# Patient Record
Sex: Female | Born: 1990 | Race: Black or African American | Hispanic: No | Marital: Single | Smoking: Never smoker
Health system: Southern US, Community
[De-identification: ages and names within clinical notes are randomized; demographics above are authoritative.]

## PROBLEM LIST (undated history)

## (undated) DIAGNOSIS — E119 Type 2 diabetes mellitus without complications: Secondary | ICD-10-CM

## (undated) DIAGNOSIS — R87629 Unspecified abnormal cytological findings in specimens from vagina: Secondary | ICD-10-CM

## (undated) DIAGNOSIS — O139 Gestational [pregnancy-induced] hypertension without significant proteinuria, unspecified trimester: Secondary | ICD-10-CM

## (undated) HISTORY — DX: Unspecified abnormal cytological findings in specimens from vagina: R87.629

## (undated) HISTORY — PX: HERNIA REPAIR: SHX51

---
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
## 2020-09-01 ENCOUNTER — Encounter (HOSPITAL_COMMUNITY): Payer: Self-pay | Admitting: *Deleted

## 2020-09-01 ENCOUNTER — Other Ambulatory Visit: Payer: Self-pay

## 2020-09-01 ENCOUNTER — Emergency Department (HOSPITAL_COMMUNITY)
Admission: EM | Admit: 2020-09-01 | Discharge: 2020-09-01 | Disposition: A | Discharge status: Home / Self Care | Payer: Medicaid Other | Attending: Emergency Medicine | Admitting: Emergency Medicine

## 2020-09-01 DIAGNOSIS — R591 Generalized enlarged lymph nodes: Secondary | ICD-10-CM | POA: Diagnosis not present

## 2020-09-01 DIAGNOSIS — R221 Localized swelling, mass and lump, neck: Secondary | ICD-10-CM | POA: Diagnosis present

## 2020-09-01 NOTE — Progress Notes (Signed)
TOC CM received referral to assist with arranging follow up appt. Attempted call to pt and mailbox is full. Unable to leave message. Isidoro Donning RN CCM, [facility] ED TOC CM [phone].

## 2020-09-01 NOTE — ED Triage Notes (Addendum)
Pt complains of lump on the right side of her neck for the past 2 months, noticed it became larger this week. No difficulty swallowing.

## 2020-09-02 NOTE — ED Provider Notes (Signed)
[facility] DEPT Provider Note   CSN: [id number] Arrival date & time: 09/01/20  1601     History No chief complaint on file.   Marilyn Hughes is a 30 y.o. female.  HPI      29yo female presents with concern for area of anterior neck swelling.  Reports she noted it 2 months ago and went to an urgent care and was told to find a PCP.  She did not find a PCP as she thinks she felt nervous and worried about finding out what was going on and was just putting it off. Over the last week she feels the area has grown. Doesn't have pain but some discomfort and pulling sensation that radiates from her anterior neck down and up towards her ear. No sore throat, no dental pain, no fevers, no night sweats. Reports some weight gain and hair falling out.  No fever cough/dyspnea. No difficulty swallowing but does feel pullingsensation at tiems.   History reviewed. No pertinent past medical history.  There are no problems to display for this patient.   History reviewed. No pertinent surgical history.   OB History   No obstetric history on file.     No family history on file.     Home Medications Prior to Admission medications   Not on File    Allergies    Patient has no allergy information on record.  Review of Systems   Review of Systems  Constitutional: Positive for fatigue and unexpected weight change. Negative for fever.  HENT: Negative for sore throat, trouble swallowing and voice change. Facial swelling: neck.   Respiratory: Negative for cough and shortness of breath.   Gastrointestinal: Negative for nausea and vomiting.  Musculoskeletal: Positive for neck pain.  Skin: Negative for wound.    Physical Exam Updated Vital Signs BP (!) 175/101   Pulse (!) 106   Temp 98.7 F (37.1 C) (Oral)   Resp 18   SpO2 96%   Physical Exam Vitals and nursing note reviewed.  Constitutional:      General: She is not in acute distress.    Appearance: Normal  appearance. She is not ill-appearing, toxic-appearing or diaphoretic.  HENT:     Head: Normocephalic.     Right Ear: Tympanic membrane normal.     Left Ear: Tympanic membrane normal.     Mouth/Throat:     Mouth: Mucous membranes are moist.     Pharynx: Oropharynx is clear. No oropharyngeal exudate or posterior oropharyngeal erythema.  Eyes:     Conjunctiva/sclera: Conjunctivae normal.  Cardiovascular:     Rate and Rhythm: Normal rate and regular rhythm.     Pulses: Normal pulses.  Pulmonary:     Effort: Pulmonary effort is normal. No respiratory distress.  Musculoskeletal:        General: No deformity or signs of injury.     Cervical back: No rigidity.  Skin:    General: Skin is warm and dry.     Coloration: Skin is not jaundiced or pale.  Neurological:     General: No focal deficit present.     Mental Status: She is alert and oriented to person, place, and time.     ED Results / Procedures / Treatments   Labs (all labs ordered are listed, but only abnormal results are displayed) Labs Reviewed - No data to display  EKG None  Radiology No results found.  Procedures Procedures (including critical care time)  Medications Ordered in ED Medications -  No data to display  ED Course  I have reviewed the triage vital signs and the nursing notes.  Pertinent labs & imaging results that were available during my care of the patient were reviewed by me and considered in my medical decision making (see chart for details).    MDM Rules/Calculators/A&P                          29yo female presents with concern for area of anterior neck swelling present for 2 months. I do not see signs of PTA, RPA, abscess, Lemierre's or airway compromise.  Mild tachycardia and hypertension may be related to stress of coming to ED and may be repeated as outpatient.  Has some tenderness and suspect adenopathy on exam. Recommend close PCP follow up for further evaluation and consulted CM for  assistance.    Final Clinical Impression(s) / ED Diagnoses Final diagnoses:  Lymphadenopathy    Rx / DC Orders ED Discharge Orders    None       Alvira Monday, MD 09/02/20 1147

## 2020-09-04 ENCOUNTER — Telehealth: Payer: Self-pay | Admitting: *Deleted

## 2020-09-04 NOTE — Telephone Encounter (Signed)
TOC CM attempted call to pt to arrange follow up appt to see PCP. Unable to leave message. Voice mailbox was full. Contacted emergency contact, Jerre Simon and states she will give message for pt to give CM a call. Isidoro Donning RN CCM, [facility] ED TOC CM [phone]

## 2020-09-04 NOTE — Telephone Encounter (Signed)
TOC CM contacted pt to give information on appt scheduled at [facility] on 09/08/2020 with Dr Alvis Lemmings at 230 pm. Explained to call and reschedule if she was unable to keep appt. ED provider updated. Isidoro Donning RN CCM, [facility] ED TOC CM [phone]

## 2020-09-08 ENCOUNTER — Other Ambulatory Visit: Payer: Self-pay

## 2020-09-08 ENCOUNTER — Encounter: Payer: Self-pay | Admitting: Family Medicine

## 2020-09-08 ENCOUNTER — Ambulatory Visit: Payer: Medicaid Other | Attending: Family Medicine | Admitting: Family Medicine

## 2020-09-08 VITALS — BP 123/84 | HR 97 | Ht 65.0 in | Wt 188.4 lb

## 2020-09-08 DIAGNOSIS — R221 Localized swelling, mass and lump, neck: Secondary | ICD-10-CM | POA: Diagnosis not present

## 2020-09-08 DIAGNOSIS — Z1159 Encounter for screening for other viral diseases: Secondary | ICD-10-CM

## 2020-09-08 NOTE — Progress Notes (Signed)
Has lump on neck and states that it is getting bigger.

## 2020-09-08 NOTE — Progress Notes (Signed)
Virtual Visit via Video Note  I connected with Marilyn Hughes, on 09/08/2020 at 2:38 PM by video enabled telemedicine device due to the COVID-19 pandemic and verified that I am speaking with the correct person using two identifiers.   Consent: I discussed the limitations, risks, security and privacy concerns of performing an evaluation and management service by telemedicine and the availability of in person appointments. I also discussed with the patient that there may be a patient responsible charge related to this service. The patient expressed understanding and agreed to proceed.   Location of Patient: Clinic  Location of Provider: Home   Persons participating in Telemedicine visit: Marilyn Hughes-CMA Dr. Margarita Rana     History of Present Illness: 30 year old female here to establish care.   She has had a neck swelling which is getting bigger and sometimes has a tugging sensation for the last 6 months. Seen at the Ed and informs me nothing was done as the E was too full and she needed to obtain a PCP. She has had a huge gain of about 30 lbs in about 3 months. She denies presence of constipation, voice hoarseness. Unknown FHx of thyroid disease.  No past medical history on file. No Known Allergies  No current outpatient medications on file prior to visit.   No current facility-administered medications on file prior to visit.    Observations/Objective: Today's Vitals   09/08/20 1430  BP: 123/84  Pulse: 97  SpO2: 100%  Weight: 188 lb 6.4 oz (85.5 kg)  Height: 5\' 5"  (1.651 m)   Body mass index is 31.35 kg/m.  Awake, alert, oriented x3 Eyes: no exolpthalmos Neck: right side neck mass, not TTP Patient directed on regions to palpate for lymphadenopathy - none detected Respiratory: not in acute distress Mood: normal  Assessment and Plan: 1. Neck swelling Suspicious for goiter Will order labs and thyroid Ultrasound and follow up at next visit - [place] Soft  Tissue Head/Neck (NON-THYROID); Future - T4, free - TSH - Basic Metabolic Panel  2. Need for hepatitis C screening test - HCV RNA quant rflx ultra or genotyp([facility])   Follow Up Instructions: Return in about 6 weeks (around 10/20/2020) for chronic disease management.    I discussed the assessment and treatment plan with the patient. The patient was provided an opportunity to ask questions and all were answered. The patient agreed with the plan and demonstrated an understanding of the instructions.   The patient was advised to call back or seek an in-person evaluation if the symptoms worsen or if the condition fails to improve as anticipated.     I provided 15 minutes total of Telehealth time during this encounter including median intraservice time, reviewing previous notes, investigations, ordering medications, medical decision making, coordinating care and patient verbalized understanding at the end of the visit.     Charlott Rakes, MD, FAAFP. [facility] [place], [place]   09/08/2020, 2:38 PM

## 2020-09-10 LAB — T4, FREE: Free T4: 0.99 ng/dL (ref 0.82–1.77)

## 2020-09-10 LAB — BASIC METABOLIC PANEL
BUN/Creatinine Ratio: 13 (ref 9–23)
BUN: 10 mg/dL (ref 6–20)
CO2: 21 mmol/L (ref 20–29)
Calcium: 9.6 mg/dL (ref 8.7–10.2)
Chloride: 102 mmol/L (ref 96–106)
Creatinine, Ser: 0.77 mg/dL (ref 0.57–1.00)
GFR calc Af Amer: 119 mL/min/{1.73_m2} (ref >59)
GFR calc non Af Amer: 103 mL/min/{1.73_m2} (ref >59)
Glucose: 121 mg/dL — ABNORMAL HIGH (ref 65–99)
Potassium: 4.1 mmol/L (ref 3.5–5.2)
Sodium: 140 mmol/L (ref 134–144)

## 2020-09-10 LAB — HCV RNA QUANT RFLX ULTRA OR GENOTYP: HCV Quant Baseline: NOT DETECTED IU/mL

## 2020-09-10 LAB — TSH: TSH: 0.758 u[IU]/mL (ref 0.450–4.500)

## 2020-09-11 ENCOUNTER — Telehealth: Payer: Self-pay | Admitting: Family Medicine

## 2020-09-11 NOTE — Telephone Encounter (Signed)
Pt returned office call about labs / please advise

## 2020-09-11 NOTE — Telephone Encounter (Signed)
Patient name and DOB has been verified Patient was informed of lab results. Patient had no questions.  

## 2020-09-15 ENCOUNTER — Ambulatory Visit (HOSPITAL_COMMUNITY): Payer: Medicaid Other | Attending: Family Medicine

## 2020-09-23 ENCOUNTER — Other Ambulatory Visit: Payer: Self-pay | Admitting: Family Medicine

## 2020-09-23 ENCOUNTER — Other Ambulatory Visit: Payer: Self-pay

## 2020-09-23 ENCOUNTER — Ambulatory Visit (HOSPITAL_COMMUNITY)
Admission: RE | Admit: 2020-09-23 | Discharge: 2020-09-23 | Disposition: A | Discharge status: Home / Self Care | Payer: Medicaid Other | Source: Ambulatory Visit | Attending: Family Medicine | Admitting: Family Medicine

## 2020-09-23 DIAGNOSIS — E042 Nontoxic multinodular goiter: Secondary | ICD-10-CM

## 2020-09-23 DIAGNOSIS — R221 Localized swelling, mass and lump, neck: Secondary | ICD-10-CM | POA: Insufficient documentation

## 2020-09-26 ENCOUNTER — Telehealth: Payer: Self-pay

## 2020-09-26 NOTE — Telephone Encounter (Signed)
-----   Message from Charlott Rakes, MD sent at 09/23/2020  2:48 PM EST ----- Can you please ensure she has seen this message? Thanks

## 2020-09-26 NOTE — Telephone Encounter (Signed)
Patient has viewed results via mychart on 09/25/20

## 2020-09-27 ENCOUNTER — Encounter: Payer: Self-pay | Admitting: Family Medicine

## 2020-09-29 ENCOUNTER — Other Ambulatory Visit: Payer: Self-pay | Admitting: Family Medicine

## 2020-09-29 MED ORDER — MELOXICAM 7.5 MG PO TABS: 7.5000 mg | ORAL_TABLET | Freq: Every day | ORAL | 1 refills | Status: DC

## 2020-11-05 ENCOUNTER — Ambulatory Visit: Payer: Medicaid Other | Admitting: Family Medicine

## 2020-11-12 ENCOUNTER — Other Ambulatory Visit: Payer: Self-pay

## 2020-11-14 ENCOUNTER — Ambulatory Visit: Payer: Medicaid Other | Admitting: Internal Medicine

## 2020-11-18 NOTE — Progress Notes (Signed)
Name: Marilyn Hughes  MRN/ DOB: [id number], April 03, 1991    Age/ Sex: 30 y.o., female    PCP: Charlott Rakes, MD   Reason for Endocrinology Evaluation: MNG     Date of Initial Endocrinology Evaluation: 11/19/2020     HPI: [name] is a 30 y.o. female with a past medical history of MNG. The patient presented for initial endocrinology clinic visit on 11/19/2020 for consultative assistance with her MNG.   She is accompanied by her sister Today    She was diagnosed with MNG based on thyroid ultrasound 08/2020. This was started after the pt noted local neck swelling that has been increasing associated with change in hair texture and weight gain.   Denies dysphagia  NO discomfort when supine   Denies radiation exposure   No FH of thyroid disease   HISTORY:   Past Medical History: No past medical history on file. Past Surgical History:  Past Surgical History:  Procedure Laterality Date  . back sx  2005   scoliosis   . HERNIA REPAIR        Social History:    Family History: family history includes Diabetes in her mother; Hypertension in her mother.   HOME MEDICATIONS: Allergies as of 11/19/2020   No Known Allergies     Medication List       Accurate as of November 19, 2020  2:18 PM. If you have any questions, ask your nurse or doctor.        meloxicam 7.5 MG tablet Commonly known as: MOBIC Take 1 tablet (7.5 mg total) by mouth daily.         REVIEW OF SYSTEMS: A comprehensive ROS was conducted with the patient and is negative except as per HPI     OBJECTIVE:  VS: BP 118/80 (BP Location: Left Arm, Patient Position: Sitting, Cuff Size: Normal)   Pulse 92   Resp 18   Ht 5\' 4"  (1.626 m)   Wt 191 lb 9.6 oz (86.9 kg)   LMP 10/27/2020   SpO2 99%   BMI 32.89 kg/m    Wt Readings from Last 3 Encounters:  11/19/20 191 lb 9.6 oz (86.9 kg)  09/08/20 188 lb 6.4 oz (85.5 kg)     EXAM: General: Pt appears well and is in NAD  Neck: General: Supple  without adenopathy. Thyroid: Thyroid size normal.  Right nodule appreciated.  Lungs: Clear with good BS bilat with no rales, rhonchi, or wheezes  Heart: Auscultation: RRR.  Abdomen: Normoactive bowel sounds, soft, nontender, without masses or organomegaly palpable  Extremities:  BL LE: No pretibial edema normal ROM and strength.  Skin: Hair: Texture and amount normal with gender appropriate distribution Skin Inspection: No rashes Skin Palpation: Skin temperature, texture, and thickness normal to palpation  Neuro: Cranial nerves: II - XII grossly intact  Motor: Normal strength throughout DTRs: 2+ and symmetric in UE without delay in relaxation phase  Mental Status: Judgment, insight: Intact Orientation: Oriented to time, place, and person Mood and affect: No depression, anxiety, or agitation     DATA REVIEWED:  Results for ROSELIND, KLUS (MRN [id number]) as of 11/18/2020 12:55  Ref. Range 09/08/2020 14:56  TSH Latest Ref Range: 0.450 - 4.500 uIU/mL 0.758  T4,Free(Direct) Latest Ref Range: 0.82 - 1.77 ng/dL 0.99    Thyroid Ultrasound 09/23/2020   Number of spongiform nodules >/=  2 cm not described below (TR1): 0  Number of mixed cystic and solid nodules >/= 1.5 cm not described below (  TR2): 0  _________________________________________________________  Nodule # 1:  Location: Right; Mid  Maximum size: 3.3 cm; Other 2 dimensions: 2.7 cm x 1.9 cm  Composition: solid/almost completely solid (2)  Echogenicity: isoechoic (1)  Shape: not taller-than-wide (0)  Margins: smooth (0)  Echogenic foci: none (0)  ACR TI-RADS total points: 3.  ACR TI-RADS risk category: TR1 (0-1 points).  ACR TI-RADS recommendations:  Nodule meets criteria for biopsy  _________________________________________________________  Nodule # 2:  Location: Right; Inferior  Maximum size: 1.3 cm; Other 2 dimensions: 1.3 cm x 1.2 cm  Composition: mixed cystic and solid  (1)  Echogenicity: isoechoic (1)  Shape: not taller-than-wide (0)  Margins: ill-defined (0)  Echogenic foci: none (0)  ACR TI-RADS total points: 2.  ACR TI-RADS risk category: TR2 (2 points).  ACR TI-RADS recommendations:  Nodule does not meet criteria for surveillance or biopsy  _________________________________________________________  Nodule # 3:  Location: Left; Mid  Maximum size: 1.4 cm; Other 2 dimensions: 1.3 cm x 1.1 cm  Composition: spongiform (0)  ACR TI-RADS recommendations:  Spongiform nodule does not meet criteria for surveillance or biopsy  _________________________________________________________  Nodule # 4:  Location: Left; Inferior  Maximum size: 2.6 cm; Other 2 dimensions: 2.0 cm x 1.8 cm  Composition: spongiform (0)  ACR TI-RADS recommendations:  Spongiform nodule does not meet criteria for surveillance or biopsy  _________________________________________________________  No adenopathy  IMPRESSION: Multinodular thyroid.  Right mid thyroid nodule (labeled 1, TR 3, 3.3 cm) meets criteria for biopsy, as designated by the newly established ACR TI-RADS criteria, and referral for biopsy is recommended.  ASSESSMENT/PLAN/RECOMMENDATIONS:   1. MNG:  - Pt is biochemically euthyroid  - No local neck symptoms  - we reviewed her ultrasound results, she in agreement in proceeding with FNA of the right mid thyroid nodule    F/U in 1 yr   Signed electronically by: Mack Guise, MD  [facility] Endocrinology  [address] [place],  [address] Phone: [phone] FAX: [phone]   CC: Charlott Rakes, [place] [address] Phone: [phone] Fax: [phone]   Return to Endocrinology clinic as below: Future Appointments  Date Time Provider [place]  12/31/2020  1:30 PM Charlott Rakes, MD [facility] None  11/19/2021  10:10 AM Marialena Wollen, Melanie Crazier, MD [facility] None

## 2020-11-19 ENCOUNTER — Other Ambulatory Visit: Payer: Self-pay

## 2020-11-19 ENCOUNTER — Ambulatory Visit (INDEPENDENT_AMBULATORY_CARE_PROVIDER_SITE_OTHER): Payer: Medicaid Other | Admitting: Internal Medicine

## 2020-11-19 ENCOUNTER — Encounter: Payer: Self-pay | Admitting: Internal Medicine

## 2020-11-19 VITALS — BP 118/80 | HR 92 | Resp 18 | Ht 64.0 in | Wt 191.6 lb

## 2020-11-19 DIAGNOSIS — E042 Nontoxic multinodular goiter: Secondary | ICD-10-CM | POA: Diagnosis not present

## 2020-12-03 ENCOUNTER — Ambulatory Visit
Admission: RE | Admit: 2020-12-03 | Discharge: 2020-12-03 | Disposition: A | Discharge status: Home / Self Care | Payer: Medicaid Other | Source: Ambulatory Visit | Attending: Internal Medicine | Admitting: Internal Medicine

## 2020-12-03 ENCOUNTER — Other Ambulatory Visit (HOSPITAL_COMMUNITY)
Admission: RE | Admit: 2020-12-03 | Discharge: 2020-12-03 | Disposition: A | Discharge status: Home / Self Care | Payer: Medicaid Other | Source: Ambulatory Visit | Attending: Internal Medicine | Admitting: Internal Medicine

## 2020-12-03 DIAGNOSIS — E041 Nontoxic single thyroid nodule: Secondary | ICD-10-CM | POA: Diagnosis present

## 2020-12-03 DIAGNOSIS — E042 Nontoxic multinodular goiter: Secondary | ICD-10-CM

## 2020-12-03 DIAGNOSIS — D497 Neoplasm of unspecified behavior of endocrine glands and other parts of nervous system: Secondary | ICD-10-CM | POA: Diagnosis not present

## 2020-12-04 LAB — CYTOLOGY - NON PAP

## 2020-12-17 ENCOUNTER — Encounter (HOSPITAL_COMMUNITY): Payer: Self-pay

## 2020-12-31 ENCOUNTER — Ambulatory Visit: Payer: Medicaid Other | Admitting: Family Medicine

## 2021-02-26 ENCOUNTER — Ambulatory Visit: Payer: Medicaid Other | Admitting: Family Medicine

## 2021-07-01 ENCOUNTER — Ambulatory Visit: Payer: Medicaid Other | Admitting: Internal Medicine

## 2021-09-03 ENCOUNTER — Emergency Department (HOSPITAL_COMMUNITY)
Admission: EM | Admit: 2021-09-03 | Discharge: 2021-09-04 | Disposition: A | Discharge status: Home / Self Care | Payer: Medicaid Other | Attending: Emergency Medicine | Admitting: Emergency Medicine

## 2021-09-03 ENCOUNTER — Encounter (HOSPITAL_COMMUNITY): Payer: Self-pay

## 2021-09-03 ENCOUNTER — Ambulatory Visit: Payer: Self-pay | Admitting: *Deleted

## 2021-09-03 ENCOUNTER — Other Ambulatory Visit: Payer: Self-pay

## 2021-09-03 DIAGNOSIS — Z5321 Procedure and treatment not carried out due to patient leaving prior to being seen by health care provider: Secondary | ICD-10-CM | POA: Diagnosis not present

## 2021-09-03 DIAGNOSIS — R42 Dizziness and giddiness: Secondary | ICD-10-CM | POA: Insufficient documentation

## 2021-09-03 DIAGNOSIS — R110 Nausea: Secondary | ICD-10-CM | POA: Insufficient documentation

## 2021-09-03 DIAGNOSIS — R632 Polyphagia: Secondary | ICD-10-CM | POA: Diagnosis not present

## 2021-09-03 DIAGNOSIS — R3589 Other polyuria: Secondary | ICD-10-CM | POA: Diagnosis not present

## 2021-09-03 DIAGNOSIS — R739 Hyperglycemia, unspecified: Secondary | ICD-10-CM | POA: Diagnosis not present

## 2021-09-03 DIAGNOSIS — R519 Headache, unspecified: Secondary | ICD-10-CM | POA: Insufficient documentation

## 2021-09-03 LAB — CBC WITH DIFFERENTIAL/PLATELET
Abs Immature Granulocytes: 0.01 10*3/uL (ref 0.00–0.07)
Basophils Absolute: 0 10*3/uL (ref 0.0–0.1)
Basophils Relative: 0 %
Eosinophils Absolute: 0.1 10*3/uL (ref 0.0–0.5)
Eosinophils Relative: 2 %
HCT: 38.3 % (ref 36.0–46.0)
Hemoglobin: 12.1 g/dL (ref 12.0–15.0)
Immature Granulocytes: 0 %
Lymphocytes Relative: 22 %
Lymphs Abs: 1.6 10*3/uL (ref 0.7–4.0)
MCH: 26.1 pg (ref 26.0–34.0)
MCHC: 31.6 g/dL (ref 30.0–36.0)
MCV: 82.7 fL (ref 80.0–100.0)
Monocytes Absolute: 0.4 10*3/uL (ref 0.1–1.0)
Monocytes Relative: 5 %
Neutro Abs: 5.3 10*3/uL (ref 1.7–7.7)
Neutrophils Relative %: 71 %
Platelets: 343 10*3/uL (ref 150–400)
RBC: 4.63 MIL/uL (ref 3.87–5.11)
RDW: 14.3 % (ref 11.5–15.5)
WBC: 7.5 10*3/uL (ref 4.0–10.5)
nRBC: 0 % (ref 0.0–0.2)

## 2021-09-03 LAB — COMPREHENSIVE METABOLIC PANEL
ALT: 19 U/L (ref 0–44)
AST: 21 U/L (ref 15–41)
Albumin: 3.8 g/dL (ref 3.5–5.0)
Alkaline Phosphatase: 104 U/L (ref 38–126)
Anion gap: 8 (ref 5–15)
BUN: 11 mg/dL (ref 6–20)
CO2: 23 mmol/L (ref 22–32)
Calcium: 9.1 mg/dL (ref 8.9–10.3)
Chloride: 103 mmol/L (ref 98–111)
Creatinine, Ser: 0.68 mg/dL (ref 0.44–1.00)
GFR, Estimated: >60 mL/min (ref >60)
Glucose, Bld: 251 mg/dL — ABNORMAL HIGH (ref 70–99)
Potassium: 4 mmol/L (ref 3.5–5.1)
Sodium: 134 mmol/L — ABNORMAL LOW (ref 135–145)
Total Bilirubin: 0.5 mg/dL (ref 0.3–1.2)
Total Protein: 8.7 g/dL — ABNORMAL HIGH (ref 6.5–8.1)

## 2021-09-03 LAB — BETA-HYDROXYBUTYRIC ACID: Beta-Hydroxybutyric Acid: 0.42 mmol/L — ABNORMAL HIGH (ref 0.05–0.27)

## 2021-09-03 NOTE — Telephone Encounter (Signed)
Reason for Disposition  New-onset diabetes suspected (e.g., frequent urination, weakness, weight loss)  Answer Assessment - Initial Assessment Questions 1. BLOOD GLUCOSE: "What is your blood glucose level?"      376- yesterday, 333- today 2. ONSET: "When did you check the blood glucose?"     1:45 3. USUAL RANGE: "What is your glucose level usually?" (e.g., usual fasting morning value, usual evening value)     Does not check 4. KETONES: "Do you check for ketones (urine or blood test strips)?" If yes, ask: "What does the test show now?"      na 5. TYPE 1 or 2:  "Do you know what type of diabetes you have?"  (e.g., Type 1, Type 2, Gestational; doesn't know)      Was gestational 2 years ago 73. INSULIN: "Do you take insulin?" "What type of insulin(s) do you use? What is the mode of delivery? (syringe, pen; injection or pump)?"      no 7. DIABETES PILLS: "Do you take any pills for your diabetes?" If yes, ask: "Have you missed taking any pills recently?"     no 8. OTHER SYMPTOMS: "Do you have any symptoms?" (e.g., fever, frequent urination, difficulty breathing, dizziness, weakness, vomiting)     Tingling in finger tips, fatigue/nausea, thirsty  9. PREGNANCY: "Is there any chance you are pregnant?" "When was your last menstrual period?"     No- LMP-12/30  Protocols used: Diabetes - High Blood Sugar-A-AH

## 2021-09-03 NOTE — Telephone Encounter (Signed)
°  Chief Complaint: elevated glucose- possible new onset Symptoms: fatigue, nausea, thirst, tingling in fingertips Frequency: 2 days Pertinent Negatives: Patient denies current diabetes diagnosis- high risk- gestational 2 years ago Disposition: [] ED /[] Urgent Care (no appt availability in office) / [] Appointment(In office/virtual)/ []  [place] [facility]/ [] Home Care/ [] Refused Recommended Disposition /[x] [place] [facility]/ []  Follow-up with PCP Additional Notes: Patient advised mobile unit- if does not make it there- UC

## 2021-09-03 NOTE — ED Triage Notes (Signed)
Pt Bib EMS from UC. Pt c/o blood sugar running high x2 years. Pt tried to see PCP today, unable to, went to UC, UC called EMS. CBG 282. Pt c/o headache.

## 2021-09-03 NOTE — ED Provider Triage Note (Signed)
Emergency Medicine Provider Triage Evaluation Note  Marilyn Hughes , a 31 y.o. female  was evaluated in triage.  Pt complains of polyphagia, polyuria, headache, dizziness, and nausea over the last couple weeks.  Patient does have a history of gestational diabetes.  Mother is diabetic and for the patient to check her blood sugars.  She checked in the last couple days and it was over 300.  She was sent to the emergency department today from urgent care.  Review of Systems  Positive:  Negative: See above   Physical Exam  BP (!) 126/98    Pulse 77    Temp 98.4 F (36.9 C) (Oral)    Resp 18    SpO2 99%  Gen:   Awake, no distress   Resp:  Normal effort  MSK:   Moves extremities without difficulty  Other:    Medical Decision Making  Medically screening exam initiated at 6:06 PM.  Appropriate orders placed.  Davis Vannatter was informed that the remainder of the evaluation will be completed by another provider, this initial triage assessment does not replace that evaluation, and the importance of remaining in the ED until their evaluation is complete.     Marilyn Hughes, [place] 09/03/21 [phone]

## 2021-09-03 NOTE — Telephone Encounter (Signed)
Called patient to schedule an apt for 09/15/2021. Pt. Is at UC to address sx's.

## 2021-09-04 ENCOUNTER — Ambulatory Visit: Payer: Self-pay

## 2021-09-04 ENCOUNTER — Ambulatory Visit
Admission: EM | Admit: 2021-09-04 | Discharge: 2021-09-04 | Disposition: A | Discharge status: Home / Self Care | Payer: Medicaid Other | Attending: Internal Medicine | Admitting: Internal Medicine

## 2021-09-04 DIAGNOSIS — R739 Hyperglycemia, unspecified: Secondary | ICD-10-CM | POA: Diagnosis not present

## 2021-09-04 MED ORDER — METFORMIN HCL 500 MG PO TABS: 500.0000 mg | ORAL_TABLET | Freq: Every day | ORAL | 0 refills | Status: DC

## 2021-09-04 MED ORDER — BLOOD GLUCOSE MONITOR KIT: PACK | 0 refills | Status: DC

## 2021-09-04 MED ORDER — FLUCONAZOLE 150 MG PO TABS: 150.0000 mg | ORAL_TABLET | Freq: Every day | ORAL | 0 refills | Status: DC

## 2021-09-04 NOTE — Telephone Encounter (Signed)
°  Chief Complaint: hyperglycemia Symptoms: BS 265, increased thirst, urination, feeling, hot, tingling in fingertips. Frequency: several days Pertinent Negatives: NA Disposition: [x] ED /[] Urgent Care (no appt availability in office) / [] Appointment(In office/virtual)/ []  [place] [facility]/ [] Home Care/ [] Refused Recommended Disposition /[] [place] [facility]/ []  Follow-up with PCP Additional Notes: Pt was advised yesterday to go to UC. She states she went to 2 different ones and they advised her nothing they could do to go to ED. Pt went to ED and left before being seen. Pt calling back today concerned about BS and symptomatic. Advised her to go to back to ED and be seen. Pt verbalized understanding.    Summary: discuss blood sugar level reading   Pt requests to speak with a nurse regarding blood sugar level reading of 265. Cb# [phone]      Reason for Disposition  New-onset diabetes suspected (e.g., frequent urination, weakness, weight loss)  Answer Assessment - Initial Assessment Questions 1. BLOOD GLUCOSE: "What is your blood glucose level?"      265 2. ONSET: "When did you check the blood glucose?"     Several days 5. TYPE 1 or 2:  "Do you know what type of diabetes you have?"  (e.g., Type 1, Type 2, Gestational; doesn't know)      unsure 6. INSULIN: "Do you take insulin?" "What type of insulin(s) do you use? What is the mode of delivery? (syringe, pen; injection or pump)?"      no 7. DIABETES PILLS: "Do you take any pills for your diabetes?" If yes, ask: "Have you missed taking any pills recently?"     no 8. OTHER SYMPTOMS: "Do you have any symptoms?" (e.g., fever, frequent urination, difficulty breathing, dizziness, weakness, vomiting)     Nausea, increased thirst and urination, hot feeling.  Protocols used: Diabetes - High Blood Sugar-A-AH

## 2021-09-04 NOTE — ED Provider Notes (Signed)
[facility]    CSN: [id number] Arrival date & time: 09/04/21  1452      History   Chief Complaint Chief Complaint  Patient presents with   Hyperglycemia    HPI Marilyn Hughes is a 31 y.o. female with past medical history of gestational diabetes presents to urgent care today with complaints of hyperglycemia.  Patient reports recent polydipsia, polyuria and fatigue she noticed 3 days ago.  Symptoms prompted her to use her mother's glucometer to check CBG which was 376 at that time.  Patient went to ED yesterday but left without being seen due to long wait times.  She attempted to make appointment with PCP but unable to be seen until 2/1.  Patient denies any recent fever or chills, diarrhea, vomiting, dysuria, chest pain, SOB.  Again patient does endorse some polyuria and polydipsia.  No recent change in weight.  Patient also feels like she has yeast infection with vaginal irritation, itching and cloudy discharge.   Had gestational diabetes with pregnancy 2020    History reviewed. No pertinent past medical history.  There are no problems to display for this patient.   Past Surgical History:  Procedure Laterality Date   back sx  2005   scoliosis    HERNIA REPAIR      OB History   No obstetric history on file.      Home Medications    Prior to Admission medications   Medication Sig Start Date End Date Taking? Authorizing Provider  blood glucose meter kit and supplies KIT Dispense based on patient and insurance preference. Use up to four times daily as directed. 09/04/21  Yes Rudolpho Sevin, NP  fluconazole (DIFLUCAN) 150 MG tablet Take 1 tablet (150 mg total) by mouth daily. 09/04/21  Yes Rudolpho Sevin, NP  metFORMIN (GLUCOPHAGE) 500 MG tablet Take 1 tablet (500 mg total) by mouth daily with breakfast. 09/04/21 09/04/22 Yes Rudolpho Sevin, NP  meloxicam (MOBIC) 7.5 MG tablet Take 1 tablet (7.5 mg total) by mouth daily. 09/29/20   Charlott Rakes, MD    Family  History Family History  Problem Relation Age of Onset   Hypertension Mother    Diabetes Mother     Social History     Allergies   Patient has no known allergies.   Review of Systems As stated in HPI otherwise negative   Physical Exam Triage Vital Signs ED Triage Vitals  Enc Vitals Group     BP 09/04/21 1548 129/89     Pulse Rate 09/04/21 1548 95     Resp 09/04/21 1548 18     Temp 09/04/21 1548 98.8 F (37.1 C)     Temp Source 09/04/21 1548 Oral     SpO2 09/04/21 1548 98 %     Weight --      Height --      Head Circumference --      Peak Flow --      Pain Score 09/04/21 1547 7     Pain Loc --      Pain Edu? --      Excl. in [place]? --    No data found.  Updated Vital Signs BP 129/89 (BP Location: Left Arm)    Pulse 95    Temp 98.8 F (37.1 C) (Oral)    Resp 18    LMP 08/28/2021    SpO2 98%   Visual Acuity Right Eye Distance:   Left Eye Distance:   Bilateral Distance:  Right Eye Near:   Left Eye Near:    Bilateral Near:     Physical Exam Constitutional:      General: She is not in acute distress.    Appearance: Normal appearance. She is not ill-appearing, toxic-appearing or diaphoretic.  HENT:     Mouth/Throat:     Mouth: Mucous membranes are moist.     Pharynx: No oropharyngeal exudate or posterior oropharyngeal erythema.  Eyes:     Extraocular Movements: Extraocular movements intact.     Conjunctiva/sclera: Conjunctivae normal.  Cardiovascular:     Rate and Rhythm: Normal rate and regular rhythm.     Heart sounds: No murmur heard.   No friction rub. No gallop.  Pulmonary:     Effort: Pulmonary effort is normal.     Breath sounds: Normal breath sounds. No wheezing, rhonchi or rales.  Abdominal:     General: Bowel sounds are normal.     Palpations: Abdomen is soft.     Tenderness: There is no abdominal tenderness. There is no right CVA tenderness, left CVA tenderness, guarding or rebound.  Musculoskeletal:     Cervical back: Normal range of  motion and neck supple. No tenderness.  Lymphadenopathy:     Cervical: No cervical adenopathy.  Skin:    General: Skin is warm and dry.  Neurological:     General: No focal deficit present.     Mental Status: She is alert and oriented to person, place, and time.     Motor: No weakness.  Psychiatric:        Mood and Affect: Mood normal.        Behavior: Behavior normal.        Thought Content: Thought content normal.     UC Treatments / Results  Labs (all labs ordered are listed, but only abnormal results are displayed) Labs Reviewed  HEMOGLOBIN A1C - Abnormal; Notable for the following components:      Result Value   Hgb A1c MFr Bld 10.1 (*)    All other components within normal limits   Narrative:    Performed at:  [address] [place], [place]  [id number] Lab Director: Rush Farmer MD, Phone:  [phone]    EKG   Radiology No results found.  Procedures Procedures (including critical care time)  Medications Ordered in UC Medications - No data to display  Initial Impression / Assessment and Plan / UC Course  I have reviewed the triage vital signs and the nursing notes.  Pertinent labs & imaging results that were available during my care of the patient were reviewed by me and considered in my medical decision making (see chart for details).  Hyperglycemia -New onset DM2 with history of gestational diabetes -Blood glucose 251 yesterday and 260 at this visit.  No evidence of HONKS or DKA on labs done in ED prior to [facility] yesterday -Patient unable to get appointment with PCP until 2/1.  In the meantime, will start on metformin $RemoveBefo'500mg'rbpHBofWiMr$  daily, daily CBG monitoring.  Will likely need increased dose at follow-up visit -Patient with good understanding of CBG monitoring and any red flag symptoms given mother's history of DM type II -Keep follow-up appointment.  Follow-up sooner for any worsening or persistent symptoms -We will check A1c for  follow-up appointment  Reviewed expections re: course of current medical issues. Questions answered. Outlined signs and symptoms indicating need for more acute intervention. Pt verbalized understanding. AVS given  Final Clinical Impressions(s) / UC Diagnoses  Final diagnoses:  Hyperglycemia     Discharge Instructions      Your lab work done last night in the emergency department is reassuring.  However your blood sugar is quite high indicating type 2 diabetes.  I am going to go ahead and start you on oral medication.  I have also written for you to get a blood glucose monitor.  Go ahead and start checking her blood sugars daily and record for your upcoming appointment.  Call community health and wellness to see if you can get in sooner than previously scheduled appointment.  Go to ER should you develop any worsening symptoms, lethargy, shortness of breath or chest pain.     ED Prescriptions     Medication Sig Dispense Auth. Provider   metFORMIN (GLUCOPHAGE) 500 MG tablet Take 1 tablet (500 mg total) by mouth daily with breakfast. 30 tablet Rudolpho Sevin, NP   blood glucose meter kit and supplies KIT Dispense based on patient and insurance preference. Use up to four times daily as directed. 1 each Rudolpho Sevin, NP   fluconazole (DIFLUCAN) 150 MG tablet Take 1 tablet (150 mg total) by mouth daily. 2 tablet Rudolpho Sevin, NP      PDMP not reviewed this encounter.   Rudolpho Sevin, NP 09/07/21 [phone]

## 2021-09-04 NOTE — Discharge Instructions (Addendum)
Your lab work done last night in the emergency department is reassuring.  However your blood sugar is quite high indicating type 2 diabetes.  I am going to go ahead and start you on oral medication.  I have also written for you to get a blood glucose monitor.  Go ahead and start checking her blood sugars daily and record for your upcoming appointment.  Call community health and wellness to see if you can get in sooner than previously scheduled appointment.  Go to ER should you develop any worsening symptoms, lethargy, shortness of breath or chest pain.

## 2021-09-04 NOTE — ED Triage Notes (Addendum)
Pt reports felling fatigue and having tingling in her limbs. Patient states she has a family hx of diabetes type 2. She does report checking her blood glucose today and it was 260. Pt does reports having headaches as well. Pt is alert & oriented x 4.

## 2021-09-05 LAB — HEMOGLOBIN A1C
Est. average glucose Bld gHb Est-mCnc: 243 mg/dL
Hgb A1c MFr Bld: 10.1 % — ABNORMAL HIGH (ref 4.8–5.6)

## 2021-09-07 NOTE — Telephone Encounter (Signed)
Pt was seen at the UC on 09/04/2021

## 2021-10-05 ENCOUNTER — Other Ambulatory Visit: Payer: Self-pay | Admitting: Family Medicine

## 2021-10-05 NOTE — Telephone Encounter (Signed)
Medication Refill - Medication: metFORMIN (GLUCOPHAGE) 500 MG tablet   Has the patient contacted their pharmacy? Yes.   (Agent: If no, request that the patient contact the pharmacy for the refill. If patient does not wish to contact the pharmacy document the reason why and proceed with request.) (Agent: If yes, when and what did the pharmacy advise?)  Preferred Pharmacy (with phone number or street name):  [id number] [address], [place]  [place] [address]  Phone: [phone] Fax: [phone]   Has the patient been seen for an appointment in the last year OR does the patient have an upcoming appointment? Yes.    Agent: Please be advised that RX refills may take up to 3 business days. We ask that you follow-up with your pharmacy.

## 2021-10-06 MED ORDER — METFORMIN HCL 500 MG PO TABS: 500.0000 mg | ORAL_TABLET | Freq: Every day | ORAL | 1 refills | Status: DC

## 2021-10-06 NOTE — Telephone Encounter (Signed)
Requested medications are due for refill today.  yes  Requested medications are on the active medications list.  yes  Last refill. 09/04/2021 #30 0 refills  Future visit scheduled.   yes  Notes to clinic.  Courtesy refill given. Pt last seen 09/08/2020.    Requested Prescriptions  Pending Prescriptions Disp Refills   metFORMIN (GLUCOPHAGE) 500 MG tablet 30 tablet 0    Sig: Take 1 tablet (500 mg total) by mouth daily with breakfast.     Endocrinology:  Diabetes - Biguanides Failed - 10/05/2021  6:17 PM      Failed - HBA1C is between 0 and 7.9 and within 180 days    Hgb A1c MFr Bld  Date Value Ref Range Status  09/04/2021 10.1 (H) 4.8 - 5.6 % Final    Comment:             Prediabetes: 5.7 - 6.4          Diabetes: >6.4          Glycemic control for adults with diabetes: <7.0           Failed - B12 Level in normal range and within 720 days    No results found for: VITAMINB12        Failed - Valid encounter within last 6 months    Recent Outpatient Visits           1 year ago Neck swelling   [place], Charlane Ferretti, MD       Future Appointments             In 1 month Charlott Rakes, MD [place] in normal range and within 360 days    Creatinine, Ser  Date Value Ref Range Status  09/03/2021 0.68 0.44 - 1.00 mg/dL Final          Passed - eGFR in normal range and within 360 days    GFR calc Af Amer  Date Value Ref Range Status  09/08/2020 119 >59 mL/min/1.73 Final    Comment:    **In accordance with recommendations from the NKF-ASN Task force,**   [facility] is in the process of updating its eGFR calculation to the   2021 CKD-EPI creatinine equation that estimates kidney function   without a race variable.    GFR, Estimated  Date Value Ref Range Status  09/03/2021 >60 >60 mL/min Final    Comment:    (NOTE) Calculated using the CKD-EPI Creatinine Equation (2021)            Passed - CBC within normal limits and completed in the last 12 months    WBC  Date Value Ref Range Status  09/03/2021 7.5 4.0 - 10.5 K/uL Final   RBC  Date Value Ref Range Status  09/03/2021 4.63 3.87 - 5.11 MIL/uL Final   Hemoglobin  Date Value Ref Range Status  09/03/2021 12.1 12.0 - 15.0 g/dL Final   HCT  Date Value Ref Range Status  09/03/2021 38.3 36.0 - 46.0 % Final   MCHC  Date Value Ref Range Status  09/03/2021 31.6 30.0 - 36.0 g/dL Final   [facility]  Date Value Ref Range Status  09/03/2021 26.1 26.0 - 34.0 pg Final   MCV  Date Value Ref Range Status  09/03/2021 82.7 80.0 - 100.0 fL Final   No results found for: PLTCOUNTKUC, LABPLAT, POCPLA RDW  Date Value Ref Range Status  09/03/2021 14.3 11.5 - 15.5 % Final

## 2021-10-07 ENCOUNTER — Other Ambulatory Visit: Payer: Self-pay | Admitting: Family Medicine

## 2021-10-07 ENCOUNTER — Encounter: Payer: Self-pay | Admitting: Family Medicine

## 2021-10-07 MED ORDER — GLUCOSE BLOOD VI STRP: ORAL_STRIP | 12 refills | Status: DC

## 2021-10-07 MED ORDER — ACCU-CHEK SOFTCLIX LANCETS MISC: 12 refills | Status: DC

## 2021-11-04 ENCOUNTER — Ambulatory Visit: Payer: Medicaid Other | Admitting: Family Medicine

## 2021-11-16 ENCOUNTER — Encounter: Payer: Self-pay | Admitting: Family Medicine

## 2021-11-16 ENCOUNTER — Other Ambulatory Visit: Payer: Self-pay

## 2021-11-16 ENCOUNTER — Ambulatory Visit: Payer: Medicaid Other | Attending: Family Medicine | Admitting: Family Medicine

## 2021-11-16 ENCOUNTER — Other Ambulatory Visit: Payer: Self-pay | Admitting: Family Medicine

## 2021-11-16 VITALS — BP 137/90 | HR 85 | Ht 64.0 in | Wt 193.2 lb

## 2021-11-16 DIAGNOSIS — E1169 Type 2 diabetes mellitus with other specified complication: Secondary | ICD-10-CM

## 2021-11-16 DIAGNOSIS — E042 Nontoxic multinodular goiter: Secondary | ICD-10-CM | POA: Diagnosis not present

## 2021-11-16 DIAGNOSIS — M546 Pain in thoracic spine: Secondary | ICD-10-CM | POA: Diagnosis not present

## 2021-11-16 DIAGNOSIS — E669 Obesity, unspecified: Secondary | ICD-10-CM

## 2021-11-16 DIAGNOSIS — Z6833 Body mass index (BMI) 33.0-33.9, adult: Secondary | ICD-10-CM

## 2021-11-16 DIAGNOSIS — G8929 Other chronic pain: Secondary | ICD-10-CM

## 2021-11-16 LAB — GLUCOSE, POCT (MANUAL RESULT ENTRY): POC Glucose: 177 mg/dl — AB (ref 70–99)

## 2021-11-16 MED ORDER — OZEMPIC (0.25 OR 0.5 MG/DOSE) 2 MG/1.5ML ~~LOC~~ SOPN: 0.2500 mg | PEN_INJECTOR | SUBCUTANEOUS | 6 refills | Status: DC

## 2021-11-16 MED ORDER — GLUCOSE BLOOD VI STRP: ORAL_STRIP | 12 refills | Status: DC

## 2021-11-16 MED ORDER — TIZANIDINE HCL 4 MG PO CAPS: 4.0000 mg | ORAL_CAPSULE | Freq: Three times a day (TID) | ORAL | 1 refills | Status: DC

## 2021-11-16 MED ORDER — METFORMIN HCL 500 MG PO TABS: 1000.0000 mg | ORAL_TABLET | Freq: Two times a day (BID) | ORAL | 6 refills | Status: DC

## 2021-11-16 NOTE — Progress Notes (Signed)
CBG 177. 

## 2021-11-16 NOTE — Progress Notes (Signed)
? ?Subjective:  ?Patient ID: Marilyn Hughes, female    DOB: [birth year]  Age: 31 y.o. MRN: [id number] ? ?CC: Diabetes ? ? ?HPI ?Marilyn Hughes is a 31 y.o. year old female with a history of multinodular thyroid, type 2 diabetes mellitus (A1c 10.1) ? ?Interval History: ?Last seen by endocrine for management of her multinodular thyroid in 10/2020, pathology of nodule was negative for malignancy and she was advised to follow-up in 1 year.  Denies presence of dysphagia or dyspnea.  She states she has an upcoming appointment. ? ?She has rods in her back from Scoliosis and can feel popping of her back on the right side ever since she gained weight. She uses excedrins for her back pain ? ?She has no hypoglycemia, neuropathy, blurry vision. Random sugars run as high as 200-230. ?No past medical history on file. ? ?Past Surgical History:  ?Procedure Laterality Date  ? back sx  2005  ? scoliosis   ? HERNIA REPAIR    ? ? ?Family History  ?Problem Relation Age of Onset  ? Hypertension Mother   ? Diabetes Mother   ? ? ?Social History  ? ?Socioeconomic History  ? Marital status: Single  ?  Spouse name: Not on file  ? Number of children: Not on file  ? Years of education: Not on file  ? Highest education level: Not on file  ?Occupational History  ? Not on file  ?Tobacco Use  ? Smoking status: Not on file  ? Smokeless tobacco: Not on file  ?Substance and Sexual Activity  ? Alcohol use: Not on file  ? Drug use: Not on file  ? Sexual activity: Not on file  ?Other Topics Concern  ? Not on file  ?Social History Narrative  ? Not on file  ? ?Social Determinants of Health  ? ?Financial Resource Strain: Not on file  ?Food Insecurity: Not on file  ?Transportation Needs: Not on file  ?Physical Activity: Not on file  ?Stress: Not on file  ?Social Connections: Not on file  ? ? ?No Known Allergies ? ?Outpatient Medications Prior to Visit  ?Medication Sig Dispense Refill  ? Accu-Chek Softclix Lancets lancets Use as instructed tid before meals 100 each  12  ? blood glucose meter kit and supplies KIT Dispense based on patient and insurance preference. Use up to four times daily as directed. 1 each 0  ? meloxicam (MOBIC) 7.5 MG tablet Take 1 tablet (7.5 mg total) by mouth daily. 30 tablet 1  ? glucose blood test strip Use as instructed 100 each 12  ? metFORMIN (GLUCOPHAGE) 500 MG tablet Take 1 tablet (500 mg total) by mouth daily with breakfast. 30 tablet 1  ? fluconazole (DIFLUCAN) 150 MG tablet Take 1 tablet (150 mg total) by mouth daily. 2 tablet 0  ? ?No facility-administered medications prior to visit.  ? ? ? ?ROS ?Review of Systems  ?Constitutional:  Negative for activity change, appetite change and fatigue.  ?HENT:  Negative for congestion, sinus pressure and sore throat.   ?Eyes:  Negative for visual disturbance.  ?Respiratory:  Negative for cough, chest tightness, shortness of breath and wheezing.   ?Cardiovascular:  Negative for chest pain and palpitations.  ?Gastrointestinal:  Negative for abdominal distention, abdominal pain and constipation.  ?Endocrine: Negative for polydipsia.  ?Genitourinary:  Negative for dysuria and frequency.  ?Musculoskeletal:  Positive for back pain. Negative for arthralgias.  ?Skin:  Negative for rash.  ?Neurological:  Negative for tremors, light-headedness and numbness.  ?Hematological:  Does not bruise/bleed easily.  ?Psychiatric/Behavioral:  Negative for agitation and behavioral problems.   ? ?Objective:  ?BP 137/90   Pulse 85   Ht $R'5\' 4"'TM$  (1.626 m)   Wt 193 lb 3.2 oz (87.6 kg)   SpO2 100%   BMI 33.16 kg/m?  ? ?BP/Weight 11/16/2021 09/04/2021 09/04/2021  ?Systolic BP 211 941 740  ?Diastolic BP 90 89 98  ?Wt. (Lbs) 193.2 - -  ?BMI 33.16 - -  ? ? ? ? ?Physical Exam ?Constitutional:   ?   Appearance: She is well-developed.  ?Neck:  ?   Comments: Goiter ?Cardiovascular:  ?   Rate and Rhythm: Normal rate.  ?   Heart sounds: Normal heart sounds. No murmur heard. ?Pulmonary:  ?   Effort: Pulmonary effort is normal.  ?   Breath sounds:  Normal breath sounds. No wheezing or rales.  ?Chest:  ?   Chest wall: No tenderness.  ?Abdominal:  ?   General: Bowel sounds are normal. There is no distension.  ?   Palpations: Abdomen is soft. There is no mass.  ?   Tenderness: There is no abdominal tenderness.  ?Musculoskeletal:     ?   General: Normal range of motion.  ?   Right lower leg: No edema.  ?   Left lower leg: No edema.  ?Neurological:  ?   Mental Status: She is alert and oriented to person, place, and time.  ?Psychiatric:     ?   Mood and Affect: Mood normal.  ? ? ?CMP Latest Ref Rng & Units 09/03/2021 09/08/2020  ?Glucose 70 - 99 mg/dL 251(H) 121(H)  ?BUN 6 - 20 mg/dL 11 10  ?Creatinine 0.44 - 1.00 mg/dL 0.68 0.77  ?Sodium 135 - 145 mmol/L 134(L) 140  ?Potassium 3.5 - 5.1 mmol/L 4.0 4.1  ?Chloride 98 - 111 mmol/L 103 102  ?CO2 22 - 32 mmol/L 23 21  ?Calcium 8.9 - 10.3 mg/dL 9.1 9.6  ?Total Protein 6.5 - 8.1 g/dL 8.7(H) -  ?Total Bilirubin 0.3 - 1.2 mg/dL 0.5 -  ?Alkaline Phos 38 - 126 U/L 104 -  ?AST 15 - 41 U/L 21 -  ?ALT 0 - 44 U/L 19 -  ? ? ?Lipid Panel  ?No results found for: CHOL, TRIG, HDL, CHOLHDL, VLDL, LDLCALC, LDLDIRECT ? ?CBC ?   ?Component Value Date/Time  ? WBC 7.5 09/03/2021 1844  ? RBC 4.63 09/03/2021 1844  ? HGB 12.1 09/03/2021 1844  ? HCT 38.3 09/03/2021 1844  ? PLT 343 09/03/2021 1844  ? MCV 82.7 09/03/2021 1844  ? MCH 26.1 09/03/2021 1844  ? MCHC 31.6 09/03/2021 1844  ? RDW 14.3 09/03/2021 1844  ? LYMPHSABS 1.6 09/03/2021 1844  ? MONOABS 0.4 09/03/2021 1844  ? EOSABS 0.1 09/03/2021 1844  ? BASOSABS 0.0 09/03/2021 1844  ? ? ?Lab Results  ?Component Value Date  ? HGBA1C 10.1 (H) 09/04/2021  ? ? ?Assessment & Plan:  ?1. Type 2 diabetes mellitus with other specified complication, without long-term current use of insulin (HCC) ?Uncontrolled with A1c of 10.1 ?Goal is less than 7.0 ?Increase metformin from 500 mg daily to 1000 mg twice daily and she has been advised to increase in a stepwise order by taking 500 mg twice daily for this week  and next week increase to 2 tablets twice daily ?She will add Ozempic once she has increased to maximum dose of metformin so as to prevent GI adverse effects ?Counseled on Diabetic diet, my plate method, 814 minutes of  moderate intensity exercise/week ?Blood sugar logs with fasting goals of 80-120 mg/dl, random of less than 180 and in the event of sugars less than 60 mg/dl or greater than 400 mg/dl encouraged to notify the clinic. ?Advised on the need for annual eye exams, annual foot exams, Pneumonia vaccine. ?- POCT glucose (manual entry) ?- metFORMIN (GLUCOPHAGE) 500 MG tablet; Take 2 tablets (1,000 mg total) by mouth 2 (two) times daily with a meal.  Dispense: 120 tablet; Refill: 6 ?- glucose blood test strip; Use as instructed 3 times daily  Dispense: 100 each; Refill: 12 ?- Semaglutide,0.25 or 0.5MG /DOS, (OZEMPIC, 0.25 OR 0.5 MG/DOSE,) 2 MG/1.5ML SOPN; Inject 0.25 mg into the skin once a week.  Dispense: 2 mL; Refill: 6 ? ?2. Chronic right-sided thoracic back pain ?History of scoliosis status post surgery ?Weight gain has worsened her symptoms ?We will place on muscle relaxant and she will work on weight loss ?At next visit if symptoms persist consider Ortho referral ?Advised to apply heat or ice whichever is tolerated to painful areas. ?Counseled on evidence of improvement in pain control with regards to yoga, water aerobics, massage, home physical therapy, exercise as tolerated. ?- tiZANidine (ZANAFLEX) 4 MG capsule; Take 1 capsule (4 mg total) by mouth 3 (three) times daily.  Dispense: 90 capsule; Refill: 1 ? ?3. Mild obesity ?Counseled on reducing portion sizes ?Addition of GLP-1 should be beneficial ? ?4. Multinodular thyroid ?Absence of obstructive symptoms ?Has upcoming appointment with endocrine. ? ? ? ?Meds ordered this encounter  ?Medications  ? metFORMIN (GLUCOPHAGE) 500 MG tablet  ?  Sig: Take 2 tablets (1,000 mg total) by mouth 2 (two) times daily with a meal.  ?  Dispense:  120 tablet  ?  Refill:   6  ?  Dose increase  ? glucose blood test strip  ?  Sig: Use as instructed 3 times daily  ?  Dispense:  100 each  ?  Refill:  12  ? tiZANidine (ZANAFLEX) 4 MG capsule  ?  Sig: Take 1 capsule (4 mg total)

## 2021-11-17 ENCOUNTER — Ambulatory Visit: Payer: Self-pay

## 2021-11-17 NOTE — Telephone Encounter (Signed)
?  Chief Complaint: Pt needs clarification regarding dose ?Symptoms: na ?Frequency: na ?Pertinent Negatives: Patient denies na ?Disposition: '[]'$ ED /'[]'$ Urgent Care (no appt availability in office) / '[]'$ Appointment(In office/virtual)/ '[]'$  [facility]/ '[]'$ Home Care/ '[]'$ Refused Recommended Disposition /'[]'$ [facility]/ '[x]'$  Follow-up with PCP ?Additional Notes: When pt was in the office yesterday, nurse mentioned something about the dose increasing at some point. Dose increase is not part of Rx instructions. Pt would like a response through "MyChart" regarding instructions for administration and dosage changes, if any.  ? ? ? ? ?Summary: pt does not understand how to take med  ? Semaglutide,0.25 or 0.'5MG'$ /DOS, (OZEMPIC, 0.25 OR 0.5 MG/DOSE,) 2 MG/1.5ML SOPN 2 mL 6 11/16/2021  ?Sig - Route: Inject 0.25 mg into the skin once a week. - Subcutaneous  ?Sent to pharmacy as: Semaglutide,0.25 or 0.'5MG'$ /DOS, (OZEMPIC, 0.25 OR 0.5 MG/DOSE,) 2 MG/1.5ML Solution Pen-injector  ?E-Prescribing Status: Receipt confirmed by pharmacy (11/16/2021  2:35 PM EDT)  ? ?Pls fu with pt as just received this med for the first time and does not understand how to take, if dosages increase, etc FU at [phone]   ?  ? ?Reason for Disposition ? [1] Caller has NON-URGENT medicine question about med that PCP prescribed AND [2] triager unable to answer question ? ?Answer Assessment - Initial Assessment Questions ?1. NAME of MEDICATION: "What medicine are you calling about?" ?    Ozempic ?2. QUESTION: "What is your question?" (e.g., double dose of medicine, side effect) ?    While in the clinic yesterday, pt thought the nurse said something about the dose increasing after a time. ?3. PRESCRIBING HCP: "Who prescribed it?" Reason: if prescribed by specialist, call should be referred to that group. ?    Dr.Newlin ?4. SYMPTOMS: "Do you have any symptoms?" ?    na ?5. SEVERITY: If symptoms are present, ask "Are they mild, moderate or severe?" ?     na ?6. PREGNANCY:  "Is there any chance that you are pregnant?" "When was your last menstrual period?" ?    na ? ?Protocols used: Medication Question Call-A-AH ? ?

## 2021-11-17 NOTE — Telephone Encounter (Signed)
It is 0.'25mg'$  injected subcutaneously once a week ?

## 2021-11-18 NOTE — Telephone Encounter (Signed)
Called pt unable to reach left VM to call back  ?

## 2021-11-18 NOTE — Telephone Encounter (Signed)
Requested medications are due for refill today.  no ? ?Requested medications are on the active medications list.  yes ? ?Last refill. 11/16/2021 #90 1 refill ? ?Future visit scheduled.   no ? ?Notes to clinic.  Duplicate request - Medication not delegated. ? ? ? ?Requested Prescriptions  ?Pending Prescriptions Disp Refills  ? tiZANidine (ZANAFLEX) 4 MG tablet [Pharmacy Med Name: TIZANIDINE HCL 4 MG TABLET] 90 tablet 0  ?  Sig: TAKE 1 TABLET BY MOUTH THREE TIMES A DAY  ?  ? Not Delegated - Cardiovascular:  Alpha-2 Agonists - tizanidine Failed - 11/16/2021  2:39 PM  ?  ?  Failed - This refill cannot be delegated  ?  ?  Passed - Valid encounter within last 6 months  ?  Recent Outpatient Visits   ? ?      ? 2 days ago Type 2 diabetes mellitus with other specified complication, without long-term current use of insulin ([place])  ? [place], MD  ? 1 year ago Neck swelling  ? [place] Charlott Rakes, MD  ? ?  ?  ? ?  ?  ?  ?  ?

## 2021-11-19 ENCOUNTER — Other Ambulatory Visit: Payer: Self-pay

## 2021-11-19 ENCOUNTER — Ambulatory Visit (INDEPENDENT_AMBULATORY_CARE_PROVIDER_SITE_OTHER): Payer: Medicaid Other | Admitting: Internal Medicine

## 2021-11-19 VITALS — BP 122/82 | HR 94 | Ht 64.0 in | Wt 189.8 lb

## 2021-11-19 DIAGNOSIS — E042 Nontoxic multinodular goiter: Secondary | ICD-10-CM

## 2021-11-19 LAB — TSH: TSH: 0.94 u[IU]/mL (ref 0.35–5.50)

## 2021-11-19 LAB — T4, FREE: Free T4: 0.92 ng/dL (ref 0.60–1.60)

## 2021-11-19 NOTE — Telephone Encounter (Signed)
Called pt unable to reach left VM to call back ?

## 2021-11-19 NOTE — Progress Notes (Signed)
? ? ?Name: Marilyn Hughes  ?MRN/ DOB: [id number], 11-15-1990    ?Age/ Sex: 31 y.o., female   ? ?PCP: Charlott Rakes, MD   ?Reason for Endocrinology Evaluation: MNG  ?   ?Date of Initial Endocrinology Evaluation: 11/19/2020  ? ? ?HPI: ?[name] is a 31 y.o. female with a past medical history of MNG. The patient presented for initial endocrinology clinic visit on 11/19/2020 for consultative assistance with her MNG.  ? ? ?She was diagnosed with MNG based on thyroid ultrasound 08/2020. This was started after the pt noted local neck swelling that has been increasing associated with change in hair texture and weight gain.  ? ?Denies radiation exposure  ? ?No FH of thyroid disease ? ? ?Thyroid ultrasound on 09/23/2020 showed multinodular goiter with right mid 3.3 cm nodule meeting FNA criteria.  She is status post FNA with follicular lesion of undetermined significance with as the category III on 12/03/2020 ?Afirma benign ? ? ?SUBJECTIVE:  ? ? ?Today (11/19/21): Marilyn Hughes is here for follow-up on multinodular goiter. She has noted right ear pain that radiates down the neck for the past few months.  ?Has also noted local neck swelling but no dysphagia  ? ?Weight stable  ?Denies constipation or diarrhea  ?Denies palpitations  ?Denies tremors  ? ? ? ? ? ?HISTORY:  ?Past Medical History: No past medical history on file. ?Past Surgical History:  ?Past Surgical History:  ?Procedure Laterality Date  ? back sx  2005  ? scoliosis   ? HERNIA REPAIR    ?  ?Social History:   ?Family History: family history includes Diabetes in her mother; Hypertension in her mother. ? ? ?HOME MEDICATIONS: ?Allergies as of 11/19/2021   ?No Known Allergies ?  ? ?  ?Medication List  ?  ? ?  ? Accurate as of November 19, 2021 10:29 AM. If you have any questions, ask your nurse or doctor.  ?  ?  ? ?  ? ?STOP taking these medications   ? ?fluconazole 150 MG tablet ?Commonly known as: Diflucan ?Stopped by: Dorita Sciara, MD ?  ? ?  ? ?TAKE these  medications   ? ?Accu-Chek Softclix Lancets lancets ?Use as instructed tid before meals ?  ?blood glucose meter kit and supplies Kit ?Dispense based on patient and insurance preference. Use up to four times daily as directed. ?  ?glucose blood test strip ?Use as instructed 3 times daily ?  ?meloxicam 7.5 MG tablet ?Commonly known as: MOBIC ?Take 1 tablet (7.5 mg total) by mouth daily. ?  ?metFORMIN 500 MG tablet ?Commonly known as: Glucophage ?Take 2 tablets (1,000 mg total) by mouth 2 (two) times daily with a meal. ?  ?Ozempic (0.25 or 0.5 MG/DOSE) 2 MG/1.5ML Sopn ?Generic drug: Semaglutide(0.25 or 0.5MG /DOS) ?Inject 0.25 mg into the skin once a week. ?  ?tiZANidine 4 MG tablet ?Commonly known as: ZANAFLEX ?TAKE 1 TABLET BY MOUTH THREE TIMES A DAY ?  ? ?  ?  ? ? ?REVIEW OF SYSTEMS: ?A comprehensive ROS was conducted with the patient and is negative except as per HPI  ? ? ? ?OBJECTIVE:  ?VS: BP 122/82   Pulse 94   Ht 5\' 4"  (1.626 m)   Wt 189 lb 12.8 oz (86.1 kg)   SpO2 99%   BMI 32.58 kg/m?   ? ?Wt Readings from Last 3 Encounters:  ?11/19/21 189 lb 12.8 oz (86.1 kg)  ?11/16/21 193 lb 3.2 oz (87.6 kg)  ?11/19/20 191  lb 9.6 oz (86.9 kg)  ? ? ? ?EXAM: ?General: Pt appears well and is in NAD ?Bilateral external auditory canal intact with intact and shiny tympanic membranes  ?Neck: General: Supple without adenopathy. ?Thyroid: Thyroid gland enlarged 60 g, right nodule appreciated.  ?Lungs: Clear with good BS bilat with no rales, rhonchi, or wheezes  ?Heart: Auscultation: RRR.  ?Abdomen: Normoactive bowel sounds, soft, nontender, without masses or organomegaly palpable  ?Extremities:  ?BL LE: No pretibial edema normal ROM and strength.  ?Mental Status: Judgment, insight: Intact ?Orientation: Oriented to time, place, and person ?Mood and affect: No depression, anxiety, or agitation  ? ? ? ?DATA REVIEWED: ? Latest Reference Range & Units 11/19/21 10:36  ?TSH 0.35 - 5.50 uIU/mL 0.94  ?T4,Free(Direct) 0.60 - 1.60 ng/dL  0.92  ? ? ? ?Thyroid Ultrasound 09/23/2020 ?  ?Number of spongiform nodules >/=  2 cm not described below (TR1): 0 ?  ?Number of mixed cystic and solid nodules >/= 1.5 cm not described ?below ([place]): 0 ?  ?_________________________________________________________ ?  ?Nodule # 1: ?  ?Location: Right; Mid ?  ?Maximum size: 3.3 cm; Other 2 dimensions: 2.7 cm x 1.9 cm ?  ?Composition: solid/almost completely solid (2) ?  ?Echogenicity: isoechoic (1) ?  ?Shape: not taller-than-wide (0) ?  ?Margins: smooth (0) ?  ?Echogenic foci: none (0) ?  ?ACR TI-RADS total points: 3. ?  ?ACR TI-RADS risk category: TR1 (0-1 points). ?  ?ACR TI-RADS recommendations: ?  ?Nodule meets criteria for biopsy ?  ?_________________________________________________________ ?  ?Nodule # 2: ?  ?Location: Right; Inferior ?  ?Maximum size: 1.3 cm; Other 2 dimensions: 1.3 cm x 1.2 cm ?  ?Composition: mixed cystic and solid (1) ?  ?Echogenicity: isoechoic (1) ?  ?Shape: not taller-than-wide (0) ?  ?Margins: ill-defined (0) ?  ?Echogenic foci: none (0) ?  ?ACR TI-RADS total points: 2. ?  ?ACR TI-RADS risk category: TR2 (2 points). ?  ?ACR TI-RADS recommendations: ?  ?Nodule does not meet criteria for surveillance or biopsy ?  ?_________________________________________________________ ?  ?Nodule # 3: ?  ?Location: Left; Mid ?  ?Maximum size: 1.4 cm; Other 2 dimensions: 1.3 cm x 1.1 cm ?  ?Composition: spongiform (0) ?  ?ACR TI-RADS recommendations: ?  ?Spongiform nodule does not meet criteria for surveillance or biopsy ?  ?_________________________________________________________ ?  ?Nodule # 4: ?  ?Location: Left; Inferior ?  ?Maximum size: 2.6 cm; Other 2 dimensions: 2.0 cm x 1.8 cm ?  ?Composition: spongiform (0) ?  ?ACR TI-RADS recommendations: ?  ?Spongiform nodule does not meet criteria for surveillance or biopsy ?  ?_________________________________________________________ ?  ?No adenopathy ?  ?IMPRESSION: ?Multinodular thyroid. ?  ?Right mid  thyroid nodule (labeled 1, TR 3, 3.3 cm) meets criteria ?for biopsy, as designated by the newly established ACR TI-RADS ?criteria, and referral for biopsy is recommended. ?  ?ASSESSMENT/PLAN/RECOMMENDATIONS:  ? ?MNG: ? ?- Pt is biochemically euthyroid  ?- Pt with local neck symptoms , we discussed the option to proceed with surgical intervention  , we discussed the need for  life long LT-4 replacement.  ?-TFTs remain normal ?-We will proceed with an updated thyroid ultrasound ? ? ? ? ?F/U in 1 yr  ? ?Signed electronically by: ?Abby Nena Jordan, MD ? ?[facility]  ?[facility] ?[address] ?[place],  [address] ?Phone: [phone] ?FAX: [phone] ? ? ?CC: ?Charlott Rakes, MD ?[place] ?[place] [address] ?Phone: [phone] ?Fax: [phone] ? ? ?  Return to Endocrinology clinic as below: ?No future appointments. ?  ? ? ? ? ? ?

## 2021-11-23 ENCOUNTER — Encounter: Payer: Medicaid Other | Admitting: Obstetrics and Gynecology

## 2021-11-24 ENCOUNTER — Inpatient Hospital Stay: Admission: RE | Admit: 2021-11-24 | Payer: Medicaid Other | Source: Ambulatory Visit

## 2021-11-25 NOTE — Telephone Encounter (Signed)
Called pt unable to reach, left VM

## 2021-11-26 NOTE — Telephone Encounter (Signed)
Pt has been sent a mycahrt message. ?

## 2021-12-08 ENCOUNTER — Ambulatory Visit
Admission: EM | Admit: 2021-12-08 | Discharge: 2021-12-08 | Disposition: A | Discharge status: Home / Self Care | Payer: Medicaid Other | Attending: Emergency Medicine | Admitting: Emergency Medicine

## 2021-12-08 ENCOUNTER — Encounter: Payer: Self-pay | Admitting: Emergency Medicine

## 2021-12-08 DIAGNOSIS — J029 Acute pharyngitis, unspecified: Secondary | ICD-10-CM | POA: Insufficient documentation

## 2021-12-08 HISTORY — DX: Type 2 diabetes mellitus without complications: E11.9

## 2021-12-08 LAB — POCT RAPID STREP A (OFFICE): Rapid Strep A Screen: NEGATIVE

## 2021-12-08 MED ORDER — CEFDINIR 300 MG PO CAPS
300.0000 mg | ORAL_CAPSULE | Freq: Two times a day (BID) | ORAL | 0 refills | Status: AC
Start: 2021-12-08 — End: 2021-12-18

## 2021-12-08 NOTE — Discharge Instructions (Addendum)
Your rapid strep test today is negative.  Streptococcal throat culture will be performed per our protocol because of the rapid strep test that we perform here at urgent care only catches 40% of known cases of group A streptococcal pharyngitis and tonsillitis.  Based on my physical exam today and the history that you provided, I believe that you have bacterial pharyngitis.    ? ?Because you have significant swelling of both tonsils, significant redness of both tonsils and white patches on both tonsils and pain with swallowing, I have provided you with a prescription for antibiotics that would like for you to begin to take now while we await the results of your throat culture.  Bacterial pharyngitis is most commonly caused by bacteria called group A Streptococcus but there are many other culprits.   ? ?Once you have been on antibiotics for a full 24 hours, you are no longer considered contagious.   Please discard your toothbrush and any other oral devices that you are currently using and replace them with new ones today.    ? ?If you receive a phone call telling you that your throat culture is negative, I still strongly recommend that you complete your entire prescription of antibiotics regardless of the result of your throat culture, particularly if you begin to feel better within the first 48 hours of antibiotics.  ? ?If you receive a phone call telling you that your throat culture was negative and you are NOT feeling any better after 48 hours of antibiotics, please return to that we can discuss how best to get you feeling better. ?  ?Please see the list below for recommended medications, dosages and frequencies to provide relief of your current symptoms:   ?  ?Cefdinir (Omnicef):  1 capsule twice daily for 10 days, you can take it with or without food.  This antibiotic can cause upset stomach, this will resolve once antibiotics are complete.  You are welcome to use a probiotic, eat yogurt, take Imodium while taking  this medication.  Please avoid other systemic medications such as Maalox, Pepto-Bismol or milk of magnesia as they can interfere with your body's ability to absorb the antibiotics. ?  ?Ibuprofen  (Advil, Motrin): This is a good anti-inflammatory medication which addresses aches, pains and inflammation of the upper airways that causes sinus and nasal congestion as well as in the lower airways which makes your cough feel tight and sometimes burn.  I recommend that you take between 400 to 600 mg every 6-8 hours as needed.    ? ?Please follow-up with your primary care provider in the next week to ten days for repeat evaluation if you have not had complete resolution of your symptoms.   ?

## 2021-12-08 NOTE — ED Provider Notes (Signed)
?[facility] ? ? ? ?CSN: [id number] ?Arrival date & time: 12/08/21  1458 ?  ? ?HISTORY  ? ?Chief Complaint  ?Patient presents with  ? Sore Throat  ? Generalized Body Aches  ? ?HPI ?Marilyn Hughes is a 31 y.o. female. Patient complains of sore throat, intermittent chills, pain with swallowing and generalized body aches for the past 24 hours.  Patient states she has tried Tylenol with little relief of her symptoms.  Rapid strep test today is negative.  Patient denies difficulty maintaining airway or managing her secretions.  Patient denies known sick contacts. ? ?The history is provided by the patient.  ?Past Medical History:  ?Diagnosis Date  ? Diabetes mellitus without complication ([place])   ? ?There are no problems to display for this patient. ? ?Past Surgical History:  ?Procedure Laterality Date  ? back sx  2005  ? scoliosis   ? HERNIA REPAIR    ? ?OB History   ?No obstetric history on file. ?  ? ?Home Medications   ? ?Prior to Admission medications   ?Medication Sig Start Date End Date Taking? Authorizing Provider  ?metFORMIN (GLUCOPHAGE) 500 MG tablet Take 2 tablets (1,000 mg total) by mouth 2 (two) times daily with a meal. 11/16/21 11/16/22 Yes Newlin, Enobong, MD  ?Semaglutide,0.25 or 0.5MG/DOS, (OZEMPIC, 0.25 OR 0.5 MG/DOSE,) 2 MG/1.5ML SOPN Inject 0.25 mg into the skin once a week. 11/16/21  Yes Charlott Rakes, MD  ?Accu-Chek Softclix Lancets lancets Use as instructed tid before meals 10/07/21   Charlott Rakes, MD  ?blood glucose meter kit and supplies KIT Dispense based on patient and insurance preference. Use up to four times daily as directed. 09/04/21   Rudolpho Sevin, NP  ?glucose blood test strip Use as instructed 3 times daily 11/16/21   Charlott Rakes, MD  ? ?Family History ?Family History  ?Problem Relation Age of Onset  ? Hypertension Mother   ? Diabetes Mother   ? ?Social History ?Social History  ? ?Tobacco Use  ? Smoking status: Never  ? Smokeless tobacco: Never  ?Vaping Use  ? Vaping Use:  Never used  ? ?Allergies   ?Patient has no known allergies. ? ?Review of Systems ?Review of Systems ?Pertinent findings noted in history of present illness.  ? ?Physical Exam ?Triage Vital Signs ?ED Triage Vitals  ?Enc Vitals Group  ?   BP 06/26/21 0827 (!) 147/82  ?   Pulse Rate 06/26/21 0827 72  ?   Resp 06/26/21 0827 18  ?   Temp 06/26/21 0827 98.3 ?F (36.8 ?C)  ?   Temp Source 06/26/21 0827 Oral  ?   SpO2 06/26/21 0827 98 %  ?   Weight --   ?   Height --   ?   Head Circumference --   ?   Peak Flow --   ?   Pain Score 06/26/21 0826 5  ?   Pain Loc --   ?   Pain Edu? --   ?   Excl. in [place]? --   ?No data found. ? ?Updated Vital Signs ?BP (!) 146/89 (BP Location: Right Arm)   Pulse (!) 103   Temp 99.5 ?F (37.5 ?C) (Oral)   Resp 16   LMP 11/10/2021 (Approximate)   SpO2 97%  ? ?Physical Exam ?Constitutional:   ?   General: She is not in acute distress. ?   Appearance: She is well-developed. She is ill-appearing. She is not toxic-appearing.  ?HENT:  ?   Head:  Normocephalic and atraumatic.  ?   Salivary Glands: Right salivary gland is diffusely enlarged and tender. Left salivary gland is diffusely enlarged and tender.  ?   Right Ear: Hearing and external ear normal.  ?   Left Ear: Hearing and external ear normal.  ?   Ears:  ?   Comments: Bilateral EACs with mild erythema, bilateral TMs are normal ?   Nose: No mucosal edema, congestion or rhinorrhea.  ?   Right Turbinates: Not enlarged, swollen or pale.  ?   Left Turbinates: Not enlarged or swollen.  ?   Right Sinus: No maxillary sinus tenderness or frontal sinus tenderness.  ?   Left Sinus: No maxillary sinus tenderness or frontal sinus tenderness.  ?   Mouth/Throat:  ?   Lips: Pink. No lesions.  ?   Mouth: Mucous membranes are moist. No oral lesions or angioedema.  ?   Dentition: No gingival swelling.  ?   Tongue: No lesions.  ?   Palate: No mass.  ?   Pharynx: Uvula midline. Pharyngeal swelling, oropharyngeal exudate and posterior oropharyngeal erythema present.  No uvula swelling.  ?   Tonsils: Tonsillar exudate present. 2+ on the right. 2+ on the left.  ?Eyes:  ?   Extraocular Movements: Extraocular movements intact.  ?   Conjunctiva/sclera: Conjunctivae normal.  ?   Pupils: Pupils are equal, round, and reactive to light.  ?Neck:  ?   Thyroid: No thyroid mass, thyromegaly or thyroid tenderness.  ?   Trachea: Tracheal tenderness present. No abnormal tracheal secretions or tracheal deviation.  ?   Comments: Voice is muffled ?Cardiovascular:  ?   Rate and Rhythm: Normal rate and regular rhythm.  ?   Pulses: Normal pulses.  ?   Heart sounds: Normal heart sounds, S1 normal and S2 normal. No murmur heard. ?  No friction rub. No gallop.  ?Pulmonary:  ?   Effort: Pulmonary effort is normal. No accessory muscle usage, prolonged expiration, respiratory distress or retractions.  ?   Breath sounds: No stridor, decreased air movement or transmitted upper airway sounds. No decreased breath sounds, wheezing, rhonchi or rales.  ?Abdominal:  ?   General: Bowel sounds are normal.  ?   Palpations: Abdomen is soft.  ?   Tenderness: There is generalized abdominal tenderness. There is no right CVA tenderness, left CVA tenderness or rebound. Negative signs include Murphy's sign.  ?   Hernia: No hernia is present.  ?Musculoskeletal:     ?   General: No tenderness. Normal range of motion.  ?   Cervical back: Full passive range of motion without pain, normal range of motion and neck supple.  ?   Right lower leg: No edema.  ?   Left lower leg: No edema.  ?Lymphadenopathy:  ?   Cervical: Cervical adenopathy present.  ?   Right cervical: Superficial cervical adenopathy present.  ?   Left cervical: Superficial cervical adenopathy present.  ?Skin: ?   General: Skin is warm and dry.  ?   Findings: No erythema, lesion or rash.  ?Neurological:  ?   General: No focal deficit present.  ?   Mental Status: She is alert and oriented to person, place, and time. Mental status is at baseline.  ?Psychiatric:     ?    Mood and Affect: Mood normal.     ?   Behavior: Behavior normal.     ?   Thought Content: Thought content normal.     ?  Judgment: Judgment normal.  ? ? ?Visual Acuity ?Right Eye Distance:   ?Left Eye Distance:   ?Bilateral Distance:   ? ?Right Eye Near:   ?Left Eye Near:    ?Bilateral Near:    ? ?UC Couse / Diagnostics / Procedures:  ?  ?EKG ? ?Radiology ?No results found. ? ?Procedures ?Procedures (including critical care time) ? ?UC Diagnoses / Final Clinical Impressions(s)   ?I have reviewed the triage vital signs and the nursing notes. ? ?Pertinent labs & imaging results that were available during my care of the patient were reviewed by me and considered in my medical decision making (see chart for details).   ?Final diagnoses:  ?Pharyngitis, unspecified etiology  ? ?Based on physical exam findings, recommend patient begin antibiotics while we await the results of her throat culture.  Patient advised that she begins to feel better after 24 hours of antibiotics she should complete the full 10-day course regardless of the results of throat culture.  Recommend ibuprofen for pain relief.  Return precautions advised. ? ?ED Prescriptions   ? ? Medication Sig Dispense Auth. Provider  ? cefdinir (OMNICEF) 300 MG capsule Take 1 capsule (300 mg total) by mouth 2 (two) times daily for 10 days. 20 capsule Lynden Oxford Scales, PA-C  ? ?  ? ?PDMP not reviewed this encounter. ? ?Pending results:  ?Labs Reviewed  ?CULTURE, GROUP A STREP [facility])  ?POCT RAPID STREP A (OFFICE)  ? ? ?Medications Ordered in UC: ?Medications - No data to display ? ?Disposition Upon Discharge:  ?Condition: stable for discharge home ?Home: take medications as prescribed; routine discharge instructions as discussed; follow up as advised. ? ?Patient presented with an acute illness with associated systemic symptoms and significant discomfort requiring urgent management. In my opinion, this is a condition that a prudent lay person (someone who  possesses an average knowledge of health and medicine) may potentially expect to result in complications if not addressed urgently such as respiratory distress, impairment of bodily function or dysfunction of bodily

## 2021-12-08 NOTE — ED Triage Notes (Signed)
Patient c/o sore throat and generalized body aches x 1 day.  ? ?Patient endorses chills at times.  ? ?Patient endorses painful swallowing.  ? ?Patient has taken Tylenol with no relief of symptoms.  ?

## 2021-12-10 LAB — CULTURE, GROUP A STREP (THRC)

## 2021-12-20 ENCOUNTER — Encounter: Payer: Self-pay | Admitting: Family Medicine

## 2021-12-20 ENCOUNTER — Other Ambulatory Visit: Payer: Self-pay | Admitting: Family Medicine

## 2021-12-20 DIAGNOSIS — E1169 Type 2 diabetes mellitus with other specified complication: Secondary | ICD-10-CM

## 2021-12-21 ENCOUNTER — Other Ambulatory Visit: Payer: Self-pay | Admitting: Family Medicine

## 2021-12-21 MED ORDER — OZEMPIC (0.25 OR 0.5 MG/DOSE) 2 MG/1.5ML ~~LOC~~ SOPN: 0.5000 mg | PEN_INJECTOR | SUBCUTANEOUS | 3 refills | Status: DC

## 2021-12-22 NOTE — Telephone Encounter (Signed)
Requested medication (s) are due for refill today: na ? ?Requested medication (s) are on the active medication list: yes ? ?Last refill:  12/21/21 #2 ml 3 refills ? ?Future visit scheduled: yes in 4 months  ? ?Notes to clinic:  Pharmacy comment: Script Clarification:PT STATES SHE WAS INSTRUCTED TO INCREASE TO 0.'5MG'$  AFTER 4 WEEKS, BUT THIS RX DOES NOT SAY THAT. PLEASE SEND NEW RX WITH UPDATED INSTRUCTIONS SO WE CAN BILL FOR A 4 WEEK SUPPLY INSTEAD OF 8. ? ? ?  ?Requested Prescriptions  ?Pending Prescriptions Disp Refills  ? OZEMPIC, 0.25 OR 0.5 MG/DOSE, 2 MG/3ML SOPN [Pharmacy Med Name: OZEMPIC 0.25-0.5 MG/DOSE PEN]  6  ?  Sig: INJECT 0.'25MG'$  INTO THE SKIN ONE TIME PER WEEK  ?  ? There is no refill protocol information for this order  ?  ? ?

## 2022-01-17 ENCOUNTER — Other Ambulatory Visit: Payer: Self-pay | Admitting: Family Medicine

## 2022-01-17 DIAGNOSIS — G8929 Other chronic pain: Secondary | ICD-10-CM

## 2022-01-19 NOTE — Telephone Encounter (Signed)
Requested medications are due for refill today.  no  Requested medications are on the active medications list.  no  Last refill. 11/18/2021 #90 1 refill  Future visit scheduled.   yes  Notes to clinic.  Medication refill is not delegated. Medication was discontinued 12/08/2021.    Requested Prescriptions  Pending Prescriptions Disp Refills   tiZANidine (ZANAFLEX) 4 MG tablet [Pharmacy Med Name: TIZANIDINE HCL 4 MG TABLET] 90 tablet 1    Sig: TAKE 1 TABLET BY MOUTH THREE TIMES A DAY     Not Delegated - Cardiovascular:  Alpha-2 Agonists - tizanidine Failed - 01/17/2022 10:31 AM      Failed - This refill cannot be delegated      Passed - Valid encounter within last 6 months    Recent Outpatient Visits           2 months ago Type 2 diabetes mellitus with other specified complication, without long-term current use of insulin ([place])   [place], Charlane Ferretti, MD   1 year ago Neck swelling   [place], Enobong, MD       Future Appointments             In 2 months Charlott Rakes, MD [place]

## 2022-01-27 IMAGING — US US THYROID
1 series · 13 of 25 positions shown · non-contrast
Comparison: None.

CLINICAL DATA: 29-year-old female with a history of neck swelling

EXAM:
THYROID ULTRASOUND
TECHNIQUE: Ultrasound examination of the thyroid gland and adjacent soft
tissues was performed.

[Series 1: us soft tissue head & neck (non-thyroid) · 50 acquisitions, 13 frames shown]
[im 1/50]
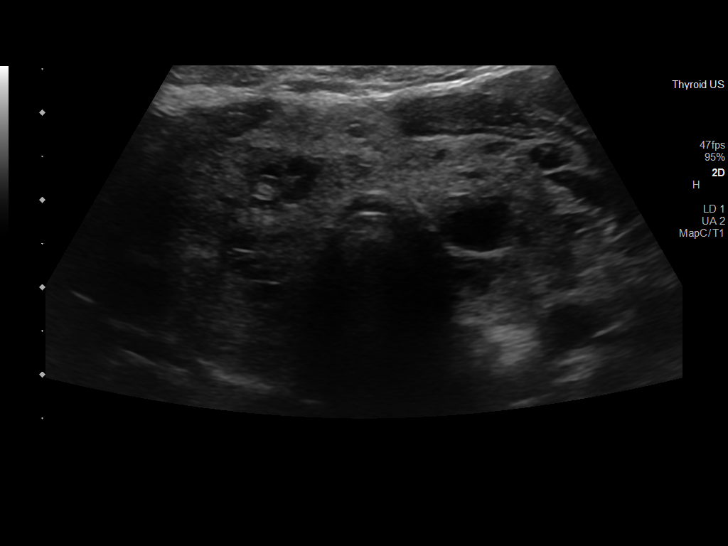
[im 5/50]
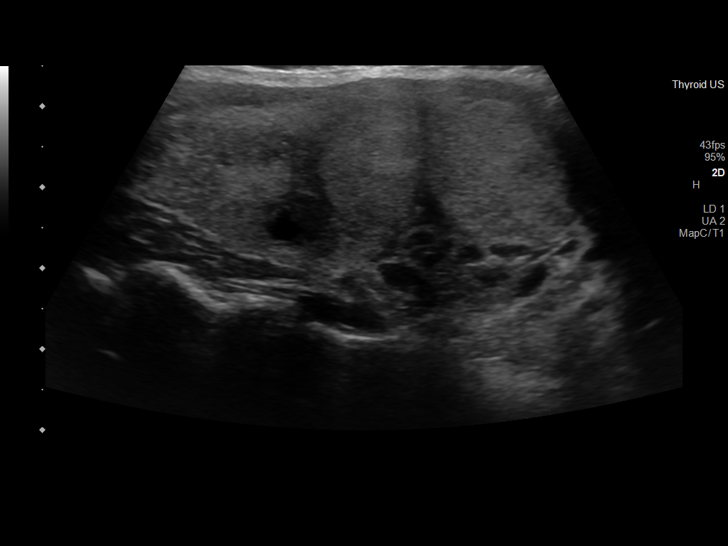
[im 9/50]
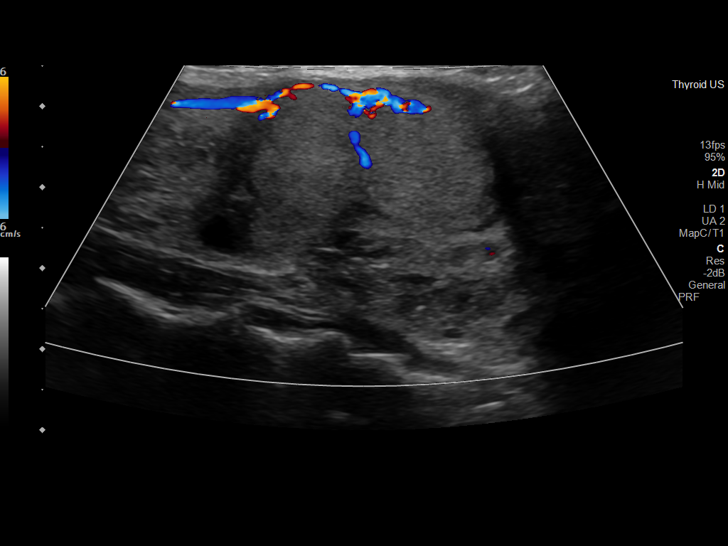
[im 13/50]
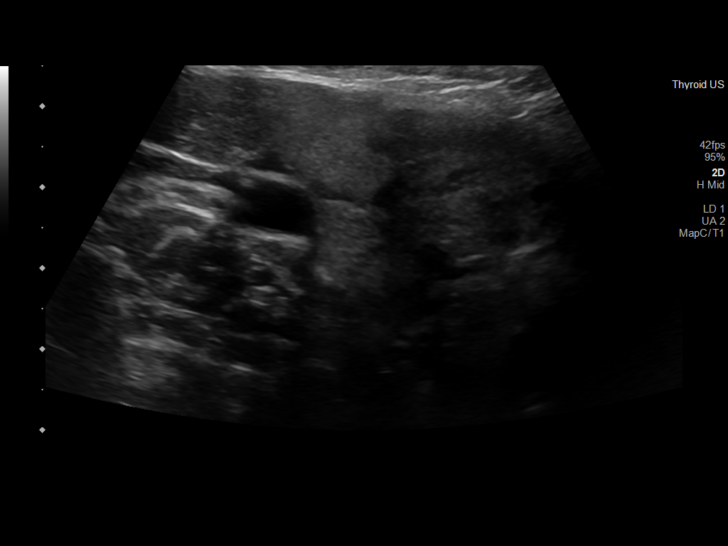
[im 17/50]
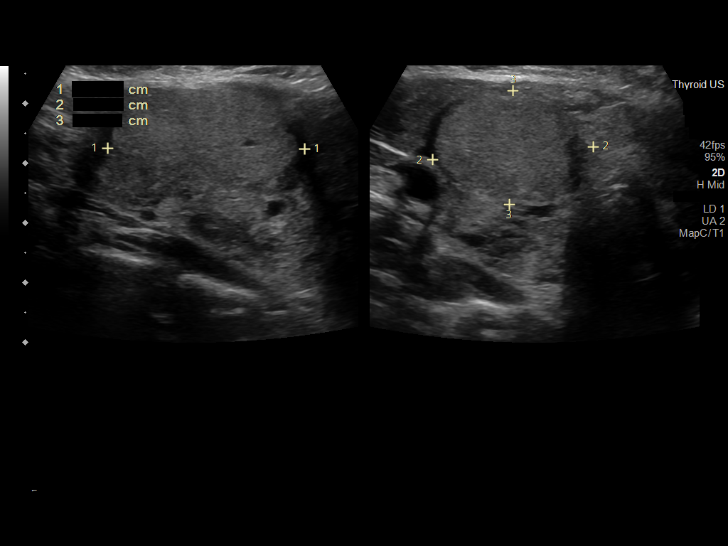
[im 21/50]
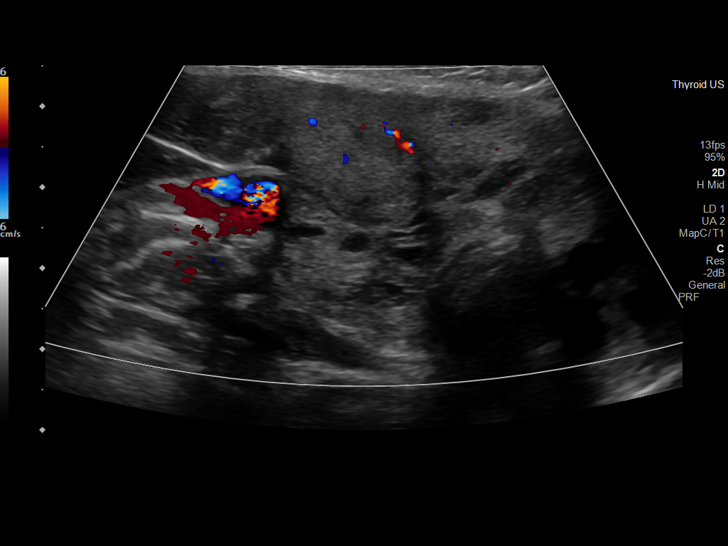
[im 25/50]
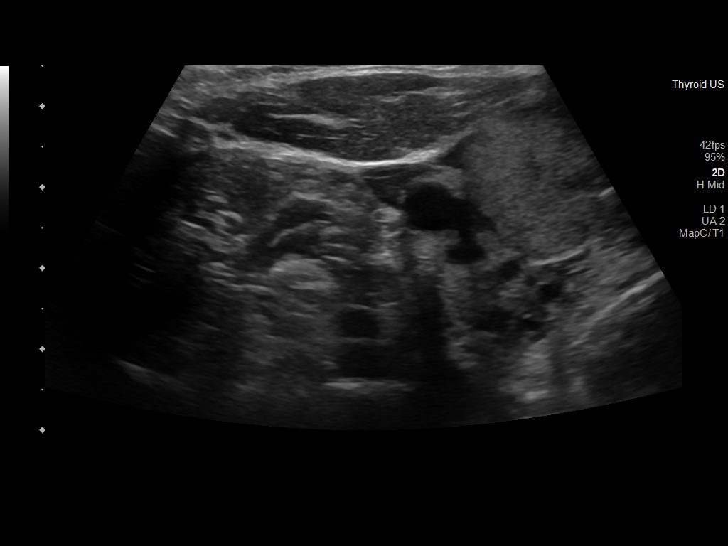
[im 29/50]
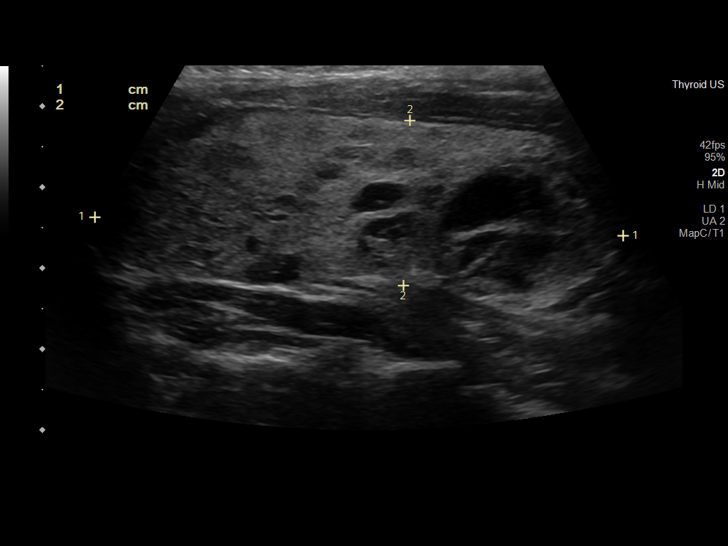
[im 33/50]
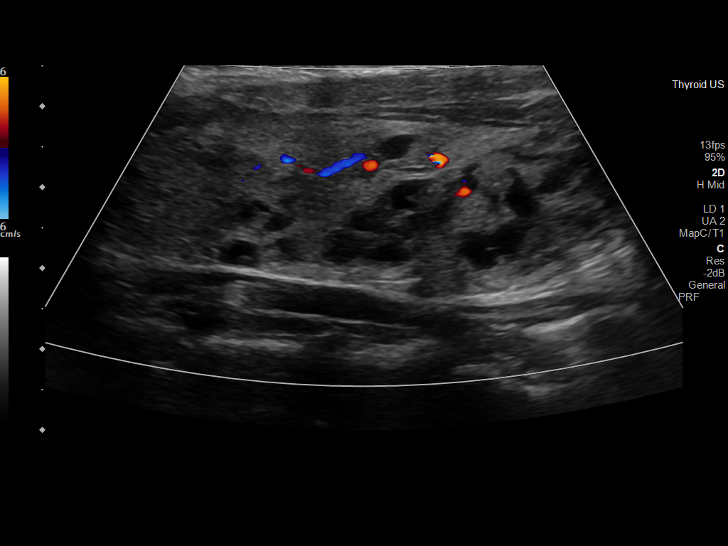
[im 37/50]
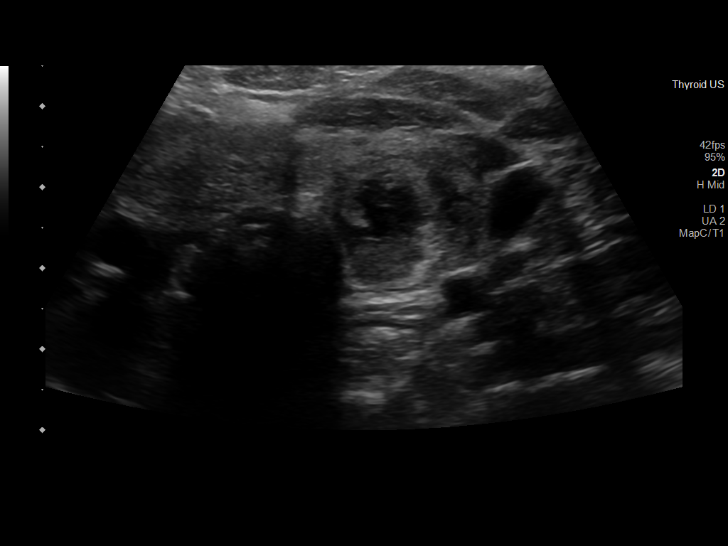
[im 41/50]
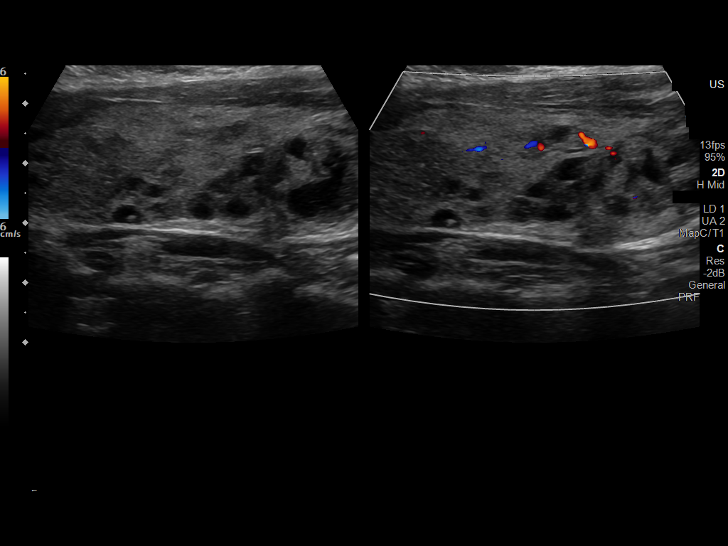
[im 45/50]
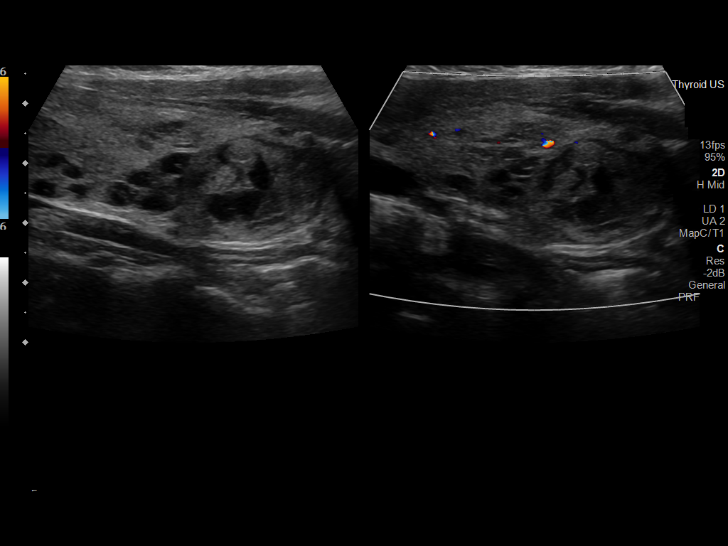
[im 50/50]
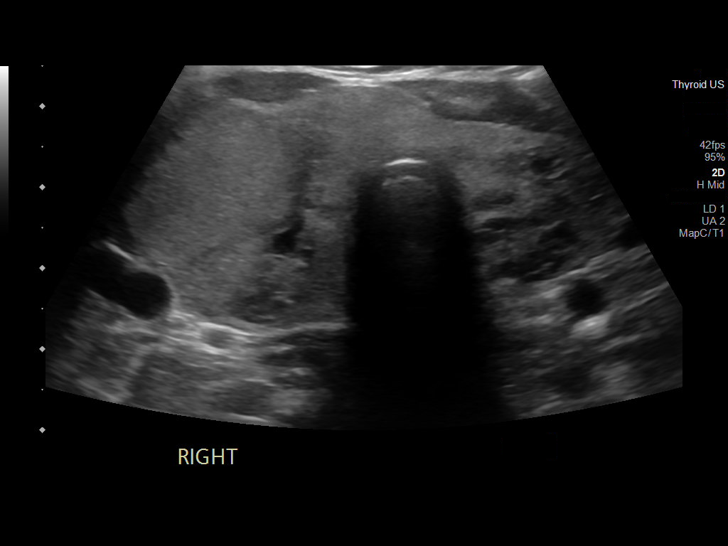

[13 of 25 positions shown; findings below may reference images not displayed]

FINDINGS: Parenchymal Echotexture: Markedly heterogenous

Isthmus: 0.9 cm

Right lobe: 6.5 cm x 4.0 cm x 2.8 cm

Left lobe: 6.5 cm x 2.0 cm x 2.1 cm

_________________________________________________________

Estimated total number of nodules >/= 1 cm: 4

Number of spongiform nodules >/=  2 cm not described below (TR1): 0

Number of mixed cystic and solid nodules >/= 1.5 cm not described
below (TR2): 0

_________________________________________________________

Nodule # 1:

Location: Right; Mid

Maximum size: 3.3 cm; Other 2 dimensions: 2.7 cm x 1.9 cm

Composition: solid/almost completely solid (2)

Echogenicity: isoechoic (1)

Shape: not taller-than-wide (0)

Margins: smooth (0)

Echogenic foci: none (0)

ACR TI-RADS total points: 3.

ACR TI-RADS risk category: TR1 (0-1 points).

ACR TI-RADS recommendations:

Nodule meets criteria for biopsy

_________________________________________________________

Nodule # 2:

Location: Right; Inferior

Maximum size: 1.3 cm; Other 2 dimensions: 1.3 cm x 1.2 cm

Composition: mixed cystic and solid (1)

Echogenicity: isoechoic (1)

Shape: not taller-than-wide (0)

Margins: ill-defined (0)

Echogenic foci: none (0)

ACR TI-RADS total points: 2.

ACR TI-RADS risk category: TR2 (2 points).

ACR TI-RADS recommendations:

Nodule does not meet criteria for surveillance or biopsy

_________________________________________________________

Nodule # 3:

Location: Left; Mid

Maximum size: 1.4 cm; Other 2 dimensions: 1.3 cm x 1.1 cm

Composition: spongiform (0)

ACR TI-RADS recommendations:

Spongiform nodule does not meet criteria for surveillance or biopsy

_________________________________________________________

Nodule # 4:

Location: Left; Inferior

Maximum size: 2.6 cm; Other 2 dimensions: 2.0 cm x 1.8 cm

Composition: spongiform (0)

ACR TI-RADS recommendations:

Spongiform nodule does not meet criteria for surveillance or biopsy

_________________________________________________________

No adenopathy
IMPRESSION: Multinodular thyroid.

Right mid thyroid nodule (labeled 1, TR 3, 3.3 cm) meets criteria
for biopsy, as designated by the newly established ACR TI-RADS
criteria, and referral for biopsy is recommended.

Recommendations follow those established by the new ACR TI-RADS
criteria ([HOSPITAL] 8062;[DATE]).

## 2022-02-26 ENCOUNTER — Other Ambulatory Visit: Payer: Self-pay | Admitting: Family Medicine

## 2022-02-26 ENCOUNTER — Ambulatory Visit
Admission: EM | Admit: 2022-02-26 | Discharge: 2022-02-26 | Disposition: A | Discharge status: Home / Self Care | Payer: Medicaid Other | Attending: Family Medicine | Admitting: Family Medicine

## 2022-02-26 DIAGNOSIS — O24111 Pre-existing diabetes mellitus, type 2, in pregnancy, first trimester: Secondary | ICD-10-CM

## 2022-02-26 DIAGNOSIS — Z3201 Encounter for pregnancy test, result positive: Secondary | ICD-10-CM

## 2022-02-26 DIAGNOSIS — E1165 Type 2 diabetes mellitus with hyperglycemia: Secondary | ICD-10-CM

## 2022-02-26 DIAGNOSIS — Z3491 Encounter for supervision of normal pregnancy, unspecified, first trimester: Secondary | ICD-10-CM

## 2022-02-26 LAB — POCT URINE PREGNANCY: Preg Test, Ur: POSITIVE — AB

## 2022-02-26 NOTE — Discharge Instructions (Addendum)
Stop your Ozempic until we confirm whether or not you are pregnant.  Your urine pregnancy test here in clinic was positive. I am prescribing you prenatal vitamins which you can start.  In the meantime while we wait on your blood work to return I need for you to check your blood sugar at least every morning fasting and prior to bed.  If you have any blood sugar readings greater than 300 contact your primary care provider. I have also included information for you to get [facility] with [facility] to establish with an OB/GYN once your blood work confirms pregnancy.

## 2022-02-26 NOTE — ED Provider Notes (Signed)
[facility]    CSN: [id number] Arrival date & time: 02/26/22  1722      History   Chief Complaint Chief Complaint  Patient presents with   Possible Pregnancy    HPI Marilyn Hughes is a 31 y.o. female.   HPI Patient with a history of diabetes, presents today with a concern for possible pregnancy. Patient's last menstrual period was 01/30/2022. She endorses nausea and altered taste which made her concern for possible pregnancy. She endorses that she has been checking her blood sugar at bedtime and readings have been less than 200. She last injected Ozempic over one week ago. She denies any vaginal bleeding. Experienced mild abdominal cramping a few days ago which has resolved.   Past Medical History:  Diagnosis Date   Diabetes mellitus without complication ([place])     There are no problems to display for this patient.   Past Surgical History:  Procedure Laterality Date   back sx  2005   scoliosis    HERNIA REPAIR      OB History     Gravida  1   Para      Term      Preterm      AB      Living         SAB      IAB      Ectopic      Multiple      Live Births               Home Medications    Prior to Admission medications   Medication Sig Start Date End Date Taking? Authorizing Provider  Accu-Chek Softclix Lancets lancets Use as instructed tid before meals 10/07/21   Charlott Rakes, MD  blood glucose meter kit and supplies KIT Dispense based on patient and insurance preference. Use up to four times daily as directed. 09/04/21   Rudolpho Sevin, NP  glucose blood test strip Use as instructed 3 times daily 11/16/21   Charlott Rakes, MD  metFORMIN (GLUCOPHAGE) 500 MG tablet Take 2 tablets (1,000 mg total) by mouth 2 (two) times daily with a meal. 11/16/21 11/16/22  Charlott Rakes, MD  tiZANidine (ZANAFLEX) 4 MG tablet TAKE 1 TABLET BY MOUTH THREE TIMES A DAY 01/19/22   Ladell Pier, MD    Family History Family History  Problem Relation  Age of Onset   Hypertension Mother    Diabetes Mother     Social History Social History   Tobacco Use   Smoking status: Never   Smokeless tobacco: Never  Vaping Use   Vaping Use: Never used     Allergies   Patient has no known allergies.   Review of Systems Review of Systems Pertinent negatives listed in HPI  Physical Exam Triage Vital Signs ED Triage Vitals  Enc Vitals Group     BP 02/26/22 1753 140/90     Pulse Rate 02/26/22 1753 (!) 103     Resp 02/26/22 1753 20     Temp 02/26/22 1753 98.3 F (36.8 C)     Temp Source 02/26/22 1753 Oral     SpO2 02/26/22 1753 99 %     Weight --      Height --      Head Circumference --      Peak Flow --      Pain Score 02/26/22 1814 3     Pain Loc --      Pain Edu? --  Excl. in GC? --    No data found.  Updated Vital Signs BP 140/90 (BP Location: Right Arm)   Pulse (!) 103   Temp 98.3 F (36.8 C) (Oral)   Resp 20   LMP 01/30/2022   SpO2 99%   Visual Acuity Right Eye Distance:   Left Eye Distance:   Bilateral Distance:    Right Eye Near:   Left Eye Near:    Bilateral Near:     Physical Exam General appearance: Alert, well developed, well nourished, cooperative  Head: Normocephalic, without obvious abnormality, atraumatic Respiratory: Respirations even and unlabored, normal respiratory rate Heart: Rate and rhythm normal. No gallop or murmurs noted on exam  Extremities: No gross deformities Skin: Skin color, texture, turgor normal. No rashes seen  Psych: Appropriate mood and affect. UC Treatments / Results  Labs (all labs ordered are listed, but only abnormal results are displayed) Labs Reviewed  POCT URINE PREGNANCY - Abnormal; Notable for the following components:      Result Value   Preg Test, Ur Positive (*)    All other components within normal limits  HCG, QUANTITATIVE, PREGNANCY    EKG   Radiology No results found.  Procedures Procedures (including critical care time)  Medications  Ordered in UC Medications - No data to display  Initial Impression / Assessment and Plan / UC Course  I have reviewed the triage vital signs and the nursing notes.  Pertinent labs & imaging results that were available during my care of the patient were reviewed by me and considered in my medical decision making (see chart for details).    Based on last menstrual period, [name] would be approximately [redacted] weeks gestation. Given the short duration of time since her last menstrual period, quantitative HCG pending. Patient is currently prescribed Ozempic which is contraindicated in pregnancy therefore medication has been d/c and metformin continued. If quantitative pregnancy indicates no pregnancy, resume Ozempic. Messaged sent via EMR to PCP to advise of Ozempic d/c until pregnancy status is  known. Patient last A1C in Jan 2023 10.1. A1C will need to rechecked due to high risk of complication with pregnancy if A1C remains not at goal. Information provided to schedule a new OB appt provided to patient. Red flags dicussed. Patient verbalized understanding and agreement with plan. Final Clinical Impressions(s) / UC Diagnoses   Final diagnoses:  Pregnancy with less than 5 completed weeks gestation  Type 2 diabetes mellitus affecting pregnancy in first trimester, antepartum     Discharge Instructions      Stop your Ozempic until we confirm whether or not you are pregnant.  Your urine pregnancy test here in clinic was positive. I am prescribing you prenatal vitamins which you can start.  In the meantime while we wait on your blood work to return I need for you to check your blood sugar at least every morning fasting and prior to bed.  If you have any blood sugar readings greater than 300 contact your primary care provider. I have also included information for you to [facility] to establish with an OB/GYN once your blood work confirms pregnancy.     ED Prescriptions    None    PDMP not reviewed this encounter.   Scot Jun, FNP 02/27/22 1321

## 2022-02-26 NOTE — ED Triage Notes (Signed)
Pt states she has been having diziness and food aversions, abd cramping and she was a day late on her cycle.   LMP: 01/30/2022

## 2022-03-04 ENCOUNTER — Encounter (HOSPITAL_COMMUNITY): Payer: Self-pay | Admitting: Obstetrics & Gynecology

## 2022-03-04 ENCOUNTER — Inpatient Hospital Stay (HOSPITAL_COMMUNITY)
Admission: AD | Admit: 2022-03-04 | Discharge: 2022-03-05 | Disposition: A | Discharge status: Home / Self Care | Payer: Medicaid Other | Attending: Obstetrics & Gynecology | Admitting: Obstetrics & Gynecology

## 2022-03-04 ENCOUNTER — Telehealth (HOSPITAL_COMMUNITY): Payer: Self-pay | Admitting: Emergency Medicine

## 2022-03-04 ENCOUNTER — Inpatient Hospital Stay (HOSPITAL_COMMUNITY): Payer: Medicaid Other

## 2022-03-04 DIAGNOSIS — R030 Elevated blood-pressure reading, without diagnosis of hypertension: Secondary | ICD-10-CM | POA: Insufficient documentation

## 2022-03-04 DIAGNOSIS — N854 Malposition of uterus: Secondary | ICD-10-CM | POA: Diagnosis not present

## 2022-03-04 DIAGNOSIS — Z3A01 Less than 8 weeks gestation of pregnancy: Secondary | ICD-10-CM | POA: Insufficient documentation

## 2022-03-04 DIAGNOSIS — O3680X0 Pregnancy with inconclusive fetal viability, not applicable or unspecified: Secondary | ICD-10-CM | POA: Diagnosis not present

## 2022-03-04 DIAGNOSIS — O210 Mild hyperemesis gravidarum: Secondary | ICD-10-CM | POA: Insufficient documentation

## 2022-03-04 DIAGNOSIS — R110 Nausea: Secondary | ICD-10-CM

## 2022-03-04 DIAGNOSIS — O3481 Maternal care for other abnormalities of pelvic organs, first trimester: Secondary | ICD-10-CM | POA: Diagnosis not present

## 2022-03-04 DIAGNOSIS — O99011 Anemia complicating pregnancy, first trimester: Secondary | ICD-10-CM | POA: Insufficient documentation

## 2022-03-04 DIAGNOSIS — O0911 Supervision of pregnancy with history of ectopic or molar pregnancy, first trimester: Secondary | ICD-10-CM | POA: Insufficient documentation

## 2022-03-04 DIAGNOSIS — N83202 Unspecified ovarian cyst, left side: Secondary | ICD-10-CM | POA: Diagnosis present

## 2022-03-04 DIAGNOSIS — O26891 Other specified pregnancy related conditions, first trimester: Secondary | ICD-10-CM | POA: Insufficient documentation

## 2022-03-04 LAB — SPECIMEN STATUS REPORT

## 2022-03-04 LAB — COMPREHENSIVE METABOLIC PANEL
ALT: 28 U/L (ref 0–44)
AST: 22 U/L (ref 15–41)
Albumin: 3.4 g/dL — ABNORMAL LOW (ref 3.5–5.0)
Alkaline Phosphatase: 67 U/L (ref 38–126)
Anion gap: 10 (ref 5–15)
BUN: 9 mg/dL (ref 6–20)
CO2: 22 mmol/L (ref 22–32)
Calcium: 9.1 mg/dL (ref 8.9–10.3)
Chloride: 106 mmol/L (ref 98–111)
Creatinine, Ser: 0.81 mg/dL (ref 0.44–1.00)
GFR, Estimated: >60 mL/min (ref >60)
Glucose, Bld: 132 mg/dL — ABNORMAL HIGH (ref 70–99)
Potassium: 3.8 mmol/L (ref 3.5–5.1)
Sodium: 138 mmol/L (ref 135–145)
Total Bilirubin: 0.4 mg/dL (ref 0.3–1.2)
Total Protein: 7.3 g/dL (ref 6.5–8.1)

## 2022-03-04 LAB — URINALYSIS, ROUTINE W REFLEX MICROSCOPIC
Bilirubin Urine: NEGATIVE
Glucose, UA: NEGATIVE mg/dL
Hgb urine dipstick: NEGATIVE
Ketones, ur: NEGATIVE mg/dL
Leukocytes,Ua: NEGATIVE
Nitrite: NEGATIVE
Protein, ur: NEGATIVE mg/dL
Specific Gravity, Urine: 1.005 (ref 1.005–1.030)
pH: 6 (ref 5.0–8.0)

## 2022-03-04 LAB — CBC
HCT: 32.2 % — ABNORMAL LOW (ref 36.0–46.0)
Hemoglobin: 10.4 g/dL — ABNORMAL LOW (ref 12.0–15.0)
MCH: 27.2 pg (ref 26.0–34.0)
MCHC: 32.3 g/dL (ref 30.0–36.0)
MCV: 84.3 fL (ref 80.0–100.0)
Platelets: 281 10*3/uL (ref 150–400)
RBC: 3.82 MIL/uL — ABNORMAL LOW (ref 3.87–5.11)
RDW: 15.8 % — ABNORMAL HIGH (ref 11.5–15.5)
WBC: 5.7 10*3/uL (ref 4.0–10.5)
nRBC: 0 % (ref 0.0–0.2)

## 2022-03-04 LAB — WET PREP, GENITAL
Clue Cells Wet Prep HPF POC: NONE SEEN
Sperm: NONE SEEN
Trich, Wet Prep: NONE SEEN
WBC, Wet Prep HPF POC: <10 (ref <10)
Yeast Wet Prep HPF POC: NONE SEEN

## 2022-03-04 LAB — HCG, QUANTITATIVE, PREGNANCY: hCG, Beta Chain, Quant, S: 2776 m[IU]/mL — ABNORMAL HIGH (ref <5)

## 2022-03-04 LAB — HCG, SERUM, QUALITATIVE: hCG,Beta Subunit,Qual,Serum: POSITIVE m[IU]/mL — AB (ref <6)

## 2022-03-04 LAB — ABO/RH: ABO/RH(D): A POS

## 2022-03-04 NOTE — Telephone Encounter (Signed)
Patient left a voicemail stating she was looking for the results of her HCG test from 6/30.  States she is having some cramping, and wasn't sure what to expect as normal.  Upon chart review, this RN notes a qualitative run, but not the original quantitative that was ordered.  Called [facility], who acknowledged the error, however they are unable to add on the correct test as too much time has passed.  Called patient and informed her to head down the MAU for further evaluation considering her positive HCG urine and HCG qualitative, and she verbalized understanding.  [facility] states they will put in a billing credit for incorrect test.

## 2022-03-04 NOTE — MAU Provider Note (Signed)
History     CSN: [id number]  Arrival date and time: 03/04/22 2216   Chief Complaint  Patient presents with   Abdominal Pain   Marilyn Hughes is a 31 year old female G4P2012 at 76w5dby LMP presenting for evaluation of abdominal cramping. + pregnancy test on 6/30 in Urgent care which is when she found out she was pregnant. Reports she has been having a lower abdomen cramping sensation intermittently for the past week or so, seems to usually occur after she jugs water. She has been having some nausea, but manageable. Otherwise has been feeling well. Denies any vaginal bleeding/discharge, vomiting, fever, or dysuria.   She has had two previous vaginal deliveries in 2013/2020. She reports a history of an ectopic/tubal pregnancy on the left a few years that she underwent surgical removal for. She presented to care to make sure this wasn't occurring again.    Past Medical History:  Diagnosis Date   Diabetes mellitus without complication ([place]     Past Surgical History:  Procedure Laterality Date   back sx  2005   scoliosis    HERNIA REPAIR      Family History  Problem Relation Age of Onset   Hypertension Mother    Diabetes Mother     Social History   Tobacco Use   Smoking status: Never   Smokeless tobacco: Never  Vaping Use   Vaping Use: Never used    Allergies: No Known Allergies  No medications prior to admission.    Review of Systems  Respiratory:  Negative for chest tightness and shortness of breath.   Gastrointestinal:  Positive for abdominal pain and nausea. Negative for abdominal distention, constipation, diarrhea and vomiting.  Genitourinary:  Negative for dysuria, frequency, vaginal bleeding and vaginal discharge.  Neurological:  Negative for dizziness, light-headedness and headaches.  Psychiatric/Behavioral:  Negative for behavioral problems.    Physical Exam   Blood pressure 121/81, pulse (!) 115, resp. rate 17, height '5\' 4"'$  (1.626 m), weight 76.2 kg, last  menstrual period 01/30/2022, SpO2 100 %.  Physical Exam Constitutional:      General: She is not in acute distress.    Appearance: She is well-developed. She is not ill-appearing.  HENT:     Head: Normocephalic and atraumatic.     Mouth/Throat:     Mouth: Mucous membranes are moist.  Eyes:     Extraocular Movements: Extraocular movements intact.  Pulmonary:     Effort: Pulmonary effort is normal.  Abdominal:     Comments: Soft, non-distended. Non-tender to palpation of all quadrants.   Musculoskeletal:     Right lower leg: No edema.     Left lower leg: No edema.  Skin:    General: Skin is warm.     Capillary Refill: Capillary refill takes less than 2 seconds.  Neurological:     Mental Status: She is alert and oriented to person, place, and time.  Psychiatric:        Behavior: Behavior normal.    MAU Course   MDM UA, UPT: UA w/o signs of infection, no patient symptoms.  CBC, HCG ABO/Rh-: A positive blood type  Wet prep and gc/chlamydia (self swabbed): wet prep negative  [place] Comp Less 14 weeks with Transvaginal   Assessment and Plan   1. Pregnancy of unknown anatomic location 2. History of left sided ectopic pregnancy  Abdominal cramping only. + Gestational sac with no yolk sac yet. B-HCG 2,776. Scheduled for ~ 48 hour MAU lab visit on  Sunday 7/9. MAU return precautions discussed if cramping more severe/vaginal bleeding.  3. Nausea in adult Mild. Discussed small frequent meals and adequate hydration. RX diclegis.   4. Normocytic anemia  Hgb 10.4 without any symptoms. Rx po iron, discussed constipation and darker stools.   5. Elevated blood pressure without diagnosis Initial BP elevated, recheck normal. Denies history of hypertension (did have preeclampsia in her last pregnancy). Encouraged getting a BP cuff to check at home intermittently and fu if elevated.   Return on 7/9 for B-HCG, or sooner if needed. Precautions discussed.     Patriciaann Clan 03/05/2022,  12:47 AM

## 2022-03-05 ENCOUNTER — Other Ambulatory Visit: Payer: Self-pay | Admitting: Family Medicine

## 2022-03-05 ENCOUNTER — Encounter: Payer: Self-pay | Admitting: Family Medicine

## 2022-03-05 DIAGNOSIS — R110 Nausea: Secondary | ICD-10-CM

## 2022-03-05 DIAGNOSIS — O3680X0 Pregnancy with inconclusive fetal viability, not applicable or unspecified: Secondary | ICD-10-CM

## 2022-03-05 DIAGNOSIS — Z3A01 Less than 8 weeks gestation of pregnancy: Secondary | ICD-10-CM

## 2022-03-05 LAB — GC/CHLAMYDIA PROBE AMP (~~LOC~~) NOT AT ARMC
Chlamydia: NEGATIVE
Comment: NEGATIVE
Comment: NORMAL
Neisseria Gonorrhea: NEGATIVE

## 2022-03-05 MED ORDER — FERROUS SULFATE 325 (65 FE) MG PO TABS: 325.0000 mg | ORAL_TABLET | ORAL | 0 refills | Status: DC

## 2022-03-05 MED ORDER — DOXYLAMINE-PYRIDOXINE 10-10 MG PO TBEC: DELAYED_RELEASE_TABLET | ORAL | 0 refills | Status: DC

## 2022-03-05 MED ORDER — LANTUS SOLOSTAR 100 UNIT/ML ~~LOC~~ SOPN: 5.0000 [IU] | PEN_INJECTOR | Freq: Every day | SUBCUTANEOUS | 1 refills | Status: DC

## 2022-03-05 MED ORDER — INSULIN PEN NEEDLE 31G X 5 MM MISC: 1.0000 | Freq: Every day | 5 refills | Status: DC

## 2022-03-05 NOTE — Progress Notes (Signed)
Dr Higinio Plan in Family Rm to discuss test results and d/c plan with pt. Pt then d/c home by provider

## 2022-03-05 NOTE — Discharge Instructions (Signed)
Please return sooner if abdominal cramping is intolerable after tylenol (up to '1000mg'$  at one time, '4000mg'$  max in one day), heavy vaginal bleeding.    Safe Medications in Pregnancy   Acne: Benzoyl Peroxide Salicylic Acid  Backache/Headache: Tylenol: 2 regular strength every 4 hours OR              2 Extra strength every 6 hours  Colds/Coughs/Allergies: Benadryl (alcohol free) 25 mg every 6 hours as needed Breath right strips Claritin Cepacol throat lozenges Chloraseptic throat spray Cold-Eeze- up to three times per day Cough drops, alcohol free Flonase (by prescription only) Guaifenesin Mucinex Robitussin DM (plain only, alcohol free) Saline nasal spray/drops Sudafed (pseudoephedrine) & Actifed ** use only after [redacted] weeks gestation and if you do not have high blood pressure Tylenol Vicks Vaporub Zinc lozenges Zyrtec   Constipation: Colace Ducolax suppositories Fleet enema Glycerin suppositories Metamucil Milk of magnesia Miralax Senokot Smooth move tea  Diarrhea: Kaopectate Imodium A-D  *NO pepto Bismol  Hemorrhoids: Anusol Anusol HC Preparation H Tucks  Indigestion: Tums Maalox Mylanta Zantac  Pepcid  Insomnia: Benadryl (alcohol free) '25mg'$  every 6 hours as needed Tylenol PM Unisom, no Gelcaps  Leg Cramps: Tums MagGel  Nausea/Vomiting:  Bonine Dramamine Emetrol Ginger extract Sea bands Meclizine  Nausea medication to take during pregnancy:  Unisom (doxylamine succinate 25 mg tablets) Take one tablet daily at bedtime. If symptoms are not adequately controlled, the dose can be increased to a maximum recommended dose of two tablets daily (1/2 tablet in the morning, 1/2 tablet mid-afternoon and one at bedtime). Vitamin B6 '100mg'$  tablets. Take one tablet twice a day (up to 200 mg per day).   **If taking multiple medications, please check labels to avoid duplicating the same active ingredients **take medication as directed on the label ** Do  not exceed 4000 mg of tylenol in 24 hours **Do not take medications that contain aspirin or ibuprofen

## 2022-03-07 ENCOUNTER — Encounter (HOSPITAL_COMMUNITY): Payer: Self-pay | Admitting: Obstetrics and Gynecology

## 2022-03-07 ENCOUNTER — Inpatient Hospital Stay (HOSPITAL_COMMUNITY)
Admit: 2022-03-07 | Discharge: 2022-03-07 | Disposition: A | Discharge status: Home / Self Care | Payer: Medicaid Other | Attending: Family Medicine | Admitting: Family Medicine

## 2022-03-07 ENCOUNTER — Inpatient Hospital Stay (HOSPITAL_COMMUNITY)
Admission: AD | Admit: 2022-03-07 | Discharge: 2022-03-07 | Disposition: A | Discharge status: Home / Self Care | Payer: Medicaid Other | Attending: Obstetrics and Gynecology | Admitting: Obstetrics and Gynecology

## 2022-03-07 DIAGNOSIS — Z794 Long term (current) use of insulin: Secondary | ICD-10-CM | POA: Insufficient documentation

## 2022-03-07 DIAGNOSIS — Z3A01 Less than 8 weeks gestation of pregnancy: Secondary | ICD-10-CM | POA: Diagnosis not present

## 2022-03-07 DIAGNOSIS — O3680X0 Pregnancy with inconclusive fetal viability, not applicable or unspecified: Secondary | ICD-10-CM | POA: Insufficient documentation

## 2022-03-07 DIAGNOSIS — O24111 Pre-existing diabetes mellitus, type 2, in pregnancy, first trimester: Secondary | ICD-10-CM | POA: Diagnosis not present

## 2022-03-07 LAB — HCG, QUANTITATIVE, PREGNANCY: hCG, Beta Chain, Quant, S: 7260 m[IU]/mL — ABNORMAL HIGH (ref <5)

## 2022-03-07 NOTE — Discharge Instructions (Signed)
Return to care  If you have heavier bleeding that soaks through more than 2 pads per hour for an hour or more If you bleed so much that you feel like you might pass out or you do pass out If you have significant abdominal pain that is not improved with Tylenol   

## 2022-03-07 NOTE — MAU Provider Note (Signed)
History   Chief Complaint:  Labs Only   Marilyn Hughes is  30 y.o. G1P0 Patient's last menstrual period was 01/30/2022.Marland Kitchen Patient is here for follow up of quantitative HCG and ongoing surveillance of pregnancy status. She is 21w1dweeks gestation  by LMP.    Since her last visit, the patient is without new complaint. The patient reports bleeding as  none now.  She denies any pain.   Her previous Quantitative HCG values are:  7/6   2776  Ultrasound on 7/6 showed empty IUGS   Physical Exam   Blood pressure (!) 143/83, pulse 95, resp. rate 18, last menstrual period 01/30/2022.  Physical Examination: General appearance - alert, well appearing, and in no distress Mental status - normal mood, behavior, speech, dress, motor activity, and thought processes Eyes - sclera anicteric Chest - normal respiratory effort  Labs: Results for orders placed or performed during the hospital encounter of 03/07/22 (from the past 24 hour(s))  hCG, quantitative, pregnancy   Collection Time: 03/07/22  8:56 PM  Result Value Ref Range   hCG, Beta Chain, Quant, S 7,260 (H) <5 mIU/mL    Ultrasound Studies:   No results found.  Assessment:   1. Pregnancy with inconclusive fetal viability, single or unspecified fetus   2. [redacted] weeks gestation of pregnancy     -Appropriate rise in HCG. Previous ultrasound showed gestational sac. Will get f/u viability ultrasound.  -Patient is T2DM on insulin. Discussed importance of good control during pregnancy.   Plan: -Discharge home in stable condition -SAB precautions discussed -[facility] -Patient may return to MAU as needed or if her condition were to change or worsen  EJorje Guild NP 03/07/2022, 11:08 PM

## 2022-03-16 ENCOUNTER — Other Ambulatory Visit: Payer: Medicaid Other

## 2022-03-16 ENCOUNTER — Other Ambulatory Visit: Payer: Self-pay | Admitting: Advanced Practice Midwife

## 2022-03-16 ENCOUNTER — Ambulatory Visit: Payer: Medicaid Other | Admitting: Advanced Practice Midwife

## 2022-03-16 DIAGNOSIS — O3680X0 Pregnancy with inconclusive fetal viability, not applicable or unspecified: Secondary | ICD-10-CM

## 2022-03-23 ENCOUNTER — Other Ambulatory Visit: Payer: Medicaid Other

## 2022-03-28 ENCOUNTER — Other Ambulatory Visit: Payer: Self-pay | Admitting: Family Medicine

## 2022-03-28 ENCOUNTER — Other Ambulatory Visit: Payer: Self-pay

## 2022-03-28 ENCOUNTER — Inpatient Hospital Stay (HOSPITAL_COMMUNITY)
Admission: AD | Admit: 2022-03-28 | Discharge: 2022-03-28 | Disposition: A | Discharge status: Home / Self Care | Payer: Medicaid Other | Attending: Family Medicine | Admitting: Family Medicine

## 2022-03-28 ENCOUNTER — Encounter (HOSPITAL_COMMUNITY): Payer: Self-pay | Admitting: Family Medicine

## 2022-03-28 DIAGNOSIS — O24111 Pre-existing diabetes mellitus, type 2, in pregnancy, first trimester: Secondary | ICD-10-CM | POA: Insufficient documentation

## 2022-03-28 DIAGNOSIS — Z3A08 8 weeks gestation of pregnancy: Secondary | ICD-10-CM | POA: Insufficient documentation

## 2022-03-28 DIAGNOSIS — O26891 Other specified pregnancy related conditions, first trimester: Secondary | ICD-10-CM | POA: Diagnosis not present

## 2022-03-28 DIAGNOSIS — E119 Type 2 diabetes mellitus without complications: Secondary | ICD-10-CM

## 2022-03-28 DIAGNOSIS — O219 Vomiting of pregnancy, unspecified: Secondary | ICD-10-CM | POA: Diagnosis not present

## 2022-03-28 DIAGNOSIS — R42 Dizziness and giddiness: Secondary | ICD-10-CM | POA: Insufficient documentation

## 2022-03-28 LAB — URINALYSIS, ROUTINE W REFLEX MICROSCOPIC
Bacteria, UA: NONE SEEN
Bilirubin Urine: NEGATIVE
Glucose, UA: NEGATIVE mg/dL
Hgb urine dipstick: NEGATIVE
Ketones, ur: NEGATIVE mg/dL
Leukocytes,Ua: NEGATIVE
Nitrite: NEGATIVE
Protein, ur: 30 mg/dL — AB
Specific Gravity, Urine: 1.021 (ref 1.005–1.030)
pH: 5 (ref 5.0–8.0)

## 2022-03-28 LAB — CBC WITH DIFFERENTIAL/PLATELET
Abs Immature Granulocytes: 0.01 10*3/uL (ref 0.00–0.07)
Basophils Absolute: 0 10*3/uL (ref 0.0–0.1)
Basophils Relative: 0 %
Eosinophils Absolute: 0.1 10*3/uL (ref 0.0–0.5)
Eosinophils Relative: 2 %
HCT: 31.5 % — ABNORMAL LOW (ref 36.0–46.0)
Hemoglobin: 10.3 g/dL — ABNORMAL LOW (ref 12.0–15.0)
Immature Granulocytes: 0 %
Lymphocytes Relative: 32 %
Lymphs Abs: 1.5 10*3/uL (ref 0.7–4.0)
MCH: 27.2 pg (ref 26.0–34.0)
MCHC: 32.7 g/dL (ref 30.0–36.0)
MCV: 83.3 fL (ref 80.0–100.0)
Monocytes Absolute: 0.4 10*3/uL (ref 0.1–1.0)
Monocytes Relative: 9 %
Neutro Abs: 2.8 10*3/uL (ref 1.7–7.7)
Neutrophils Relative %: 57 %
Platelets: 249 10*3/uL (ref 150–400)
RBC: 3.78 MIL/uL — ABNORMAL LOW (ref 3.87–5.11)
RDW: 15.8 % — ABNORMAL HIGH (ref 11.5–15.5)
Smear Review: NORMAL
WBC: 4.8 10*3/uL (ref 4.0–10.5)
nRBC: 0 % (ref 0.0–0.2)

## 2022-03-28 LAB — GLUCOSE, CAPILLARY: Glucose-Capillary: 99 mg/dL (ref 70–99)

## 2022-03-28 LAB — HEMOGLOBIN A1C
Hgb A1c MFr Bld: 5.7 % — ABNORMAL HIGH (ref 4.8–5.6)
Mean Plasma Glucose: 116.89 mg/dL

## 2022-03-28 MED ORDER — PROMETHAZINE HCL 12.5 MG PO TABS: 12.5000 mg | ORAL_TABLET | Freq: Four times a day (QID) | ORAL | 0 refills | Status: DC | PRN

## 2022-03-28 NOTE — MAU Provider Note (Signed)
History     CSN: [id number]  Arrival date and time: 03/28/22 1720   Event Date/Time   First Provider Initiated Contact with Patient 03/28/22 1758      Chief Complaint  Patient presents with   Nausea   Emesis   Shortness of Breath   Dizziness   Marilyn Hughes is a 31 y.o. [phone] at 3w1dwho presents to MAU for "feeling terrible." Patient states she feels dizzy and feels short of breath only when walking and these are not present at this time. Patient also states she cannot eat or drink because she is nauseated and vomits.  Patient states she has never experienced dizziness until 4 weeks ago. Patient states it is intermittent and she can control it my concentrating on something and closing her eyes. When she closes her eyes she does feel like the room is spinning. Patient does not feel like the room is spinning when she opens her eyes. Patient reports feeling slightly dizzy at this time. Patient denies trying any treatments for dizziness.  Patient states the nausea and vomiting started about two weeks ago. Patient states she is nauseous all the time. Patient states she vomits no more than 4 times each day. Patient states she is able to eat snacks like chips, crackers, fruit. Patient states she does drink a lot of water. Patient states she was given Diclegis two weeks ago, but it is not working for her. Patient states she has been taking Diclegis 3 times daily but it has not helped.  Patient also states that she has intermittent tingling in her fingers that she has had since she got diagnosed with diabetes 4 months ago.  Pt denies VB, LOF, ctx, decreased FM, vaginal discharge/odor/itching. Pt denies abdominal pain, constipation, diarrhea, or urinary problems. Pt denies fever, chills, fatigue, sweating or changes in appetite. Pt denies SOB or chest pain. Pt denies HA, light-headedness, weakness.  Problems this pregnancy include: pt has not yet been seen. Allergies? NKDA Current  medications/supplements? Insulin, Metformin, iron Prenatal care provider? Pt requests a list of OB providers    OB History     Gravida  4   Para  2   Term  1   Preterm  1   AB  1   Living  2      SAB      IAB      Ectopic  1   Multiple      Live Births  2           Past Medical History:  Diagnosis Date   Diabetes mellitus without complication ([place]     Past Surgical History:  Procedure Laterality Date   back sx  2005   scoliosis    HERNIA REPAIR      Family History  Problem Relation Age of Onset   Hypertension Mother    Diabetes Mother     Social History   Tobacco Use   Smoking status: Never   Smokeless tobacco: Never  Vaping Use   Vaping Use: Never used    Allergies: No Known Allergies  Medications Prior to Admission  Medication Sig Dispense Refill Last Dose   Doxylamine-Pyridoxine (DICLEGIS) 10-10 MG TBEC Take 2 tabs at bedtime. If needed, add another tab in the morning. If needed, add another tab in the afternoon, up to 4 tabs/day. 100 tablet 0 03/28/2022   ferrous sulfate 325 (65 FE) MG tablet Take 1 tablet (325 mg total) by mouth every other day. 15  tablet 0 03/27/2022   insulin glargine (LANTUS SOLOSTAR) 100 UNIT/ML Solostar Pen Inject 5 Units into the skin daily. 15 mL 1    Insulin Pen Needle 31G X 5 MM MISC 1 each by Does not apply route at bedtime. 30 each 5     Review of Systems  Constitutional:  Negative for chills, diaphoresis, fatigue and fever.  Eyes:  Negative for visual disturbance.  Respiratory:  Negative for shortness of breath.   Cardiovascular:  Negative for chest pain.  Gastrointestinal:  Positive for nausea and vomiting. Negative for abdominal pain, constipation and diarrhea.  Genitourinary:  Negative for dysuria, flank pain, frequency, pelvic pain, urgency, vaginal bleeding and vaginal discharge.  Neurological:  Positive for dizziness. Negative for weakness, light-headedness and headaches.   Physical Exam   Blood  pressure 120/72, pulse 89, temperature 98.4 F (36.9 C), temperature source Oral, resp. rate 17, last menstrual period 01/30/2022, SpO2 100 %.  Patient Vitals for the past 24 hrs:  BP Temp Temp src Pulse Resp SpO2  03/28/22 1850 120/72 -- -- 89 -- 100 %  03/28/22 1846 122/73 -- -- 80 -- 100 %  03/28/22 1843 124/70 -- -- 75 -- --  03/28/22 1831 (!) 110/97 -- -- 80 -- --  03/28/22 1800 113/67 -- -- 82 -- 100 %  03/28/22 1749 134/79 -- -- 83 -- --  03/28/22 1727 (!) 142/91 98.4 F (36.9 C) Oral 87 17 --  03/28/22 1726 -- -- -- -- -- 100 %   Physical Exam Vitals and nursing note reviewed.  Constitutional:      General: She is not in acute distress.    Appearance: Normal appearance. She is not ill-appearing, toxic-appearing or diaphoretic.  HENT:     Head: Normocephalic and atraumatic.  Pulmonary:     Effort: Pulmonary effort is normal.     Breath sounds: Normal breath sounds.  Neurological:     Mental Status: She is alert and oriented to person, place, and time.  Psychiatric:        Mood and Affect: Mood normal.        Behavior: Behavior normal.        Thought Content: Thought content normal.        Judgment: Judgment normal.    Results for orders placed or performed during the hospital encounter of 03/28/22 (from the past 24 hour(s))  Urinalysis, Routine w reflex microscopic Urine, Clean Catch     Status: Abnormal   Collection Time: 03/28/22  5:34 PM  Result Value Ref Range   Color, Urine YELLOW YELLOW   APPearance CLEAR CLEAR   Specific Gravity, Urine 1.021 1.005 - 1.030   pH 5.0 5.0 - 8.0   Glucose, UA NEGATIVE NEGATIVE mg/dL   Hgb urine dipstick NEGATIVE NEGATIVE   Bilirubin Urine NEGATIVE NEGATIVE   Ketones, ur NEGATIVE NEGATIVE mg/dL   Protein, ur 30 (A) NEGATIVE mg/dL   Nitrite NEGATIVE NEGATIVE   Leukocytes,Ua NEGATIVE NEGATIVE   RBC / HPF 0-5 0 - 5 RBC/hpf   WBC, UA 0-5 0 - 5 WBC/hpf   Bacteria, UA NONE SEEN NONE SEEN   Squamous Epithelial / LPF 0-5 0 - 5    Mucus PRESENT   Glucose, capillary     Status: None   Collection Time: 03/28/22  5:55 PM  Result Value Ref Range   Glucose-Capillary 99 70 - 99 mg/dL  CBC with Differential/Platelet     Status: Abnormal   Collection Time: 03/28/22  7:05 PM  Result  Value Ref Range   WBC 4.8 4.0 - 10.5 K/uL   RBC 3.78 (L) 3.87 - 5.11 MIL/uL   Hemoglobin 10.3 (L) 12.0 - 15.0 g/dL   HCT 31.5 (L) 36.0 - 46.0 %   MCV 83.3 80.0 - 100.0 fL   MCH 27.2 26.0 - 34.0 pg   MCHC 32.7 30.0 - 36.0 g/dL   RDW 15.8 (H) 11.5 - 15.5 %   Platelets 249 150 - 400 K/uL   nRBC 0.0 0.0 - 0.2 %   Neutrophils Relative % 57 %   Neutro Abs 2.8 1.7 - 7.7 K/uL   Lymphocytes Relative 32 %   Lymphs Abs 1.5 0.7 - 4.0 K/uL   Monocytes Relative 9 %   Monocytes Absolute 0.4 0.1 - 1.0 K/uL   Eosinophils Relative 2 %   Eosinophils Absolute 0.1 0.0 - 0.5 K/uL   Basophils Relative 0 %   Basophils Absolute 0.0 0.0 - 0.1 K/uL   WBC Morphology MORPHOLOGY UNREMARKABLE    RBC Morphology MORPHOLOGY UNREMARKABLE    Smear Review Normal platelet morphology    Immature Granulocytes 0 %   Abs Immature Granulocytes 0.01 0.00 - 0.07 K/uL   US OB LESS THAN 14 WEEKS WITH OB TRANSVAGINAL  Result Date: 03/04/2022 CLINICAL DATA:  Pregnant patient in early trimester pregnancy with cramping for 2 days. Gestational age based on LMP 4 weeks 4 days EXAM: OBSTETRIC <14 WK [place] AND TRANSVAGINAL OB US TECHNIQUE: Both transabdominal and transvaginal ultrasound examinations were performed for complete evaluation of the gestation as well as the maternal uterus, adnexal regions, and pelvic cul-de-sac. Transvaginal technique was performed to assess early pregnancy. COMPARISON:  None Available. FINDINGS: Intrauterine gestational sac: Single Yolk sac:  Not Visualized. Embryo:  Not Visualized. Cardiac Activity: Not Visualized. MSD: 4.4 mm   5 w   1 d Subchorionic hemorrhage:  None visualized. Maternal uterus/adnexae: Anteverted uterus. The right ovary is visualized and is  normal. There is a small cyst/prominent follicle measuring 2.1 cm. Ovarian blood flow is seen. No adnexal mass. No pelvic free fluid. IMPRESSION: 1. Probable early intrauterine gestational sac, but no yolk sac, fetal pole, or cardiac activity yet visualized. Recommend follow-up quantitative B-HCG levels and follow-up US in 14 days to assess viability. This recommendation follows SRU consensus guidelines: Diagnostic Criteria for Nonviable Pregnancy Early in the First Trimester. [place] [employer] Med 2013; 741:6384-53. 2. Prominent left ovarian follicle/small cyst measuring 2.1 cm, considered physiologic Electronically Signed   By: Keith Rake M.D.   On: 03/04/2022 23:45    MAU Course  Procedures  MDM -dizziness, nausea, vomiting -CBG: 99 -EKG: normal -Orthostatic VS for the past 24 hrs:  BP- Lying Pulse- Lying BP- Sitting Pulse- Sitting BP- Standing at 0 minutes Pulse- Standing at 0 minutes  03/28/22 1843 124/70 75 122/73 80 120/72 89   -UA: 30PRO, otherwise WNL -CBC: no abnormalities requiring treatment -pt able to tolerate PO intake in MAU -pt discharged to home in stable condition  Orders Placed This Encounter  Procedures   Urinalysis, Routine w reflex microscopic Urine, Clean Catch    Standing Status:   Standing    Number of Occurrences:   1   Glucose, capillary    Standing Status:   Standing    Number of Occurrences:   1   CBC with Differential/Platelet    Standing Status:   Standing    Number of Occurrences:   1   Hemoglobin A1c    Standing Status:   Standing  Number of Occurrences:   1   AMB Referral to Cardio Obstetrics    Referral Priority:   Routine    Referral Type:   Consultation    Referral Reason:   Specialty Services Required    Requested Specialty:   Cardiology    Number of Visits Requested:   1   Orthostatic vital signs    Standing Status:   Standing    Number of Occurrences:   1   EKG 12-Lead    Standing Status:   Standing    Number of Occurrences:   1    Discharge patient    Order Specific Question:   Discharge disposition    Answer:   01-Home or Self Care [1]    Order Specific Question:   Discharge patient date    Answer:   03/28/2022   Meds ordered this encounter  Medications   promethazine (PHENERGAN) 12.5 MG tablet    Sig: Take 1 tablet (12.5 mg total) by mouth every 6 (six) hours as needed for nausea or vomiting.    Dispense:  30 tablet    Refill:  0    Order Specific Question:   Supervising Provider    Answer:   Donnamae Jude [[id number]]   Assessment and Plan   1. Dizziness   2. Type 2 diabetes mellitus without complication, without long-term current use of insulin ([place])   3. Nausea and vomiting in pregnancy     Allergies as of 03/28/2022   No Known Allergies      Medication List     TAKE these medications    Doxylamine-Pyridoxine 10-10 MG Tbec Commonly known as: Diclegis Take 2 tabs at bedtime. If needed, add another tab in the morning. If needed, add another tab in the afternoon, up to 4 tabs/day.   ferrous sulfate 325 (65 FE) MG tablet Take 1 tablet (325 mg total) by mouth every other day.   Insulin Pen Needle 31G X 5 MM Misc 1 each by Does not apply route at bedtime.   Lantus SoloStar 100 UNIT/ML Solostar Pen Generic drug: insulin glargine Inject 5 Units into the skin daily.   promethazine 12.5 MG tablet Commonly known as: PHENERGAN Take 1 tablet (12.5 mg total) by mouth every 6 (six) hours as needed for nausea or vomiting.       -keep [place] scheduled for 03/30/2022 for viability (not performed during MAU visit as patient without pain or bleeding) -referral to OB Cardio -safe meds in pregnancy list given, discussed Bonine for dizziness and OTC N/V medications -discussed non-pharmacologic treatments of N/V -list of OB providers given -RX phenergan -return MAU precautions given -pt discharged to home in stable condition  Elmyra Ricks E Izaya [place] 03/28/2022, 9:03 PM

## 2022-03-28 NOTE — Discharge Instructions (Signed)
Safe Medications in Pregnancy    Acne: Benzoyl Peroxide Salicylic Acid  Backache/Headache: Tylenol: 2 regular strength every 4 hours OR              2 Extra strength every 6 hours  Colds/Coughs/Allergies: Benadryl (alcohol free) 25 mg every 6 hours as needed Breath right strips Claritin Cepacol throat lozenges Chloraseptic throat spray Cold-Eeze- up to three times per day Cough drops, alcohol free Flonase (by prescription only) Guaifenesin Mucinex Robitussin DM (plain only, alcohol free) Saline nasal spray/drops Sudafed (pseudoephedrine) & Actifed ** use only after [redacted] weeks gestation and if you do not have high blood pressure Tylenol Vicks Vaporub Zinc lozenges Zyrtec   Constipation: Colace Ducolax suppositories Fleet enema Glycerin suppositories Metamucil Milk of magnesia Miralax Senokot Smooth move tea  Diarrhea: Kaopectate Imodium A-D  *NO pepto Bismol  Hemorrhoids: Anusol Anusol HC Preparation H Tucks  Indigestion: Tums Maalox Mylanta Zantac  Pepcid  Insomnia: Benadryl (alcohol free) '25mg'$  every 6 hours as needed Tylenol PM Unisom, no Gelcaps  Leg Cramps: Tums MagGel  Nausea/Vomiting:  Bonine Dramamine Emetrol Ginger extract Sea bands Meclizine  Nausea medication to take during pregnancy:  Unisom (doxylamine succinate 25 mg tablets) Take one tablet daily at bedtime. If symptoms are not adequately controlled, the dose can be increased to a maximum recommended dose of two tablets daily (1/2 tablet in the morning, 1/2 tablet mid-afternoon and one at bedtime). Vitamin B6 '100mg'$  tablets. Take one tablet twice a day (up to 200 mg per day).  Skin Rashes: Aveeno products Benadryl cream or '25mg'$  every 6 hours as needed Calamine Lotion 1% cortisone cream  Yeast infection: Gyne-lotrimin 7 Monistat 7   **If taking multiple medications, please check labels to avoid duplicating the same active ingredients **take  medication as directed on the label ** Do not exceed 4000 mg of tylenol in 24 hours **Do not take medications that contain aspirin or ibuprofen         [facility] [place] [facility] [place] [facility] - accepts patients without insurance  Phone: [place] for [employer] @ [place]   Phone: [place] [facility]  [place]       Phone: [place] [facility] [place] [facility]     Phone: [place] for [employer] @ [employer]   Phone: [place] for [place] @ [facility] - accepts patients without insurance  Phone: [place] for [place] [facility] Phone: [phone]     [facility] Department - accepts patients without insurance Phone: [facility]  Phone: [phone]  [facility] Phone: [phone]  [facility] Phone: [phone]  [facility] Phone: [phone]  [facility] Phone: [phone]  [facility]  Phone: [phone]

## 2022-03-28 NOTE — MAU Note (Signed)
Pt reports to mau with c/o "not feeling well" for the past week.  States she is "always short of breath and feels weak".  Reports nausea and some vomiting.  Reports emesis X3 in last 24 hours.  Denies abd pain or bleeding.  States her current nausea med isnt helping.  Diet recall: half of a taco at 10am and water at 1400.

## 2022-03-30 ENCOUNTER — Ambulatory Visit (INDEPENDENT_AMBULATORY_CARE_PROVIDER_SITE_OTHER): Payer: Medicaid Other

## 2022-03-30 DIAGNOSIS — O3680X0 Pregnancy with inconclusive fetal viability, not applicable or unspecified: Secondary | ICD-10-CM | POA: Diagnosis not present

## 2022-03-31 ENCOUNTER — Ambulatory Visit: Payer: Medicaid Other | Admitting: Family Medicine

## 2022-04-08 IMAGING — US US FNA BIOPSY THYROID 1ST LESION
1 series · 13 of 17 positions shown · non-contrast
Comparison: US THYROID 09/23/2020

MEDICATIONS:
3 mL 1% lidocaine

COMPLICATIONS:
None immediate.

INDICATION: Indeterminate thyroid nodule of the right mid thyroid. Request is
made for fine-needle aspiration of indeterminate nodule.

EXAM:
ULTRASOUND GUIDED FINE NEEDLE ASPIRATION OF INDETERMINATE THYROID
NODULE
TECHNIQUE: Informed written consent was obtained from the patient after a
discussion of the risks, benefits and alternatives to treatment.
Questions regarding the procedure were encouraged and answered. A
timeout was performed prior to the initiation of the procedure.

[Series 1: us fna biopsy thyroid 1st lesion · 0.06mm/px · 17 acquisitions, 13 frames shown]
[im 1/17]
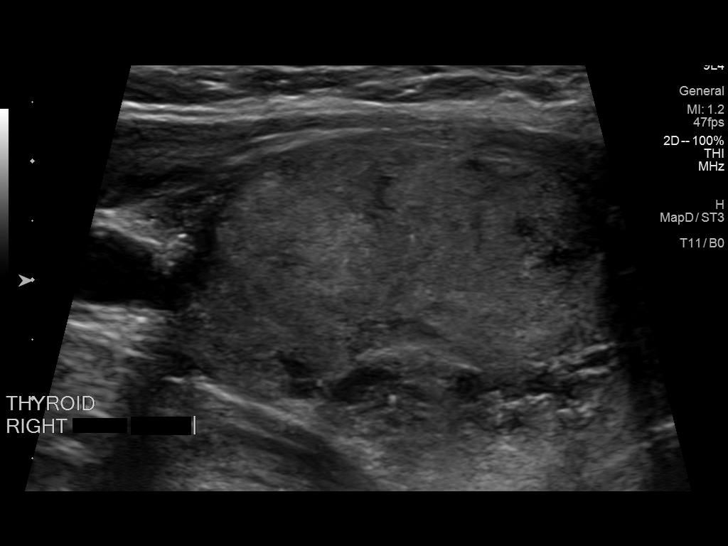
[im 2/17]
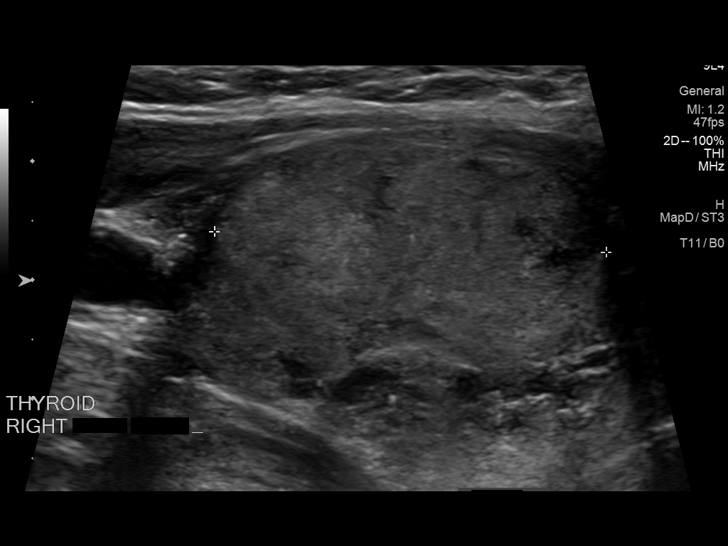
[im 4/17]
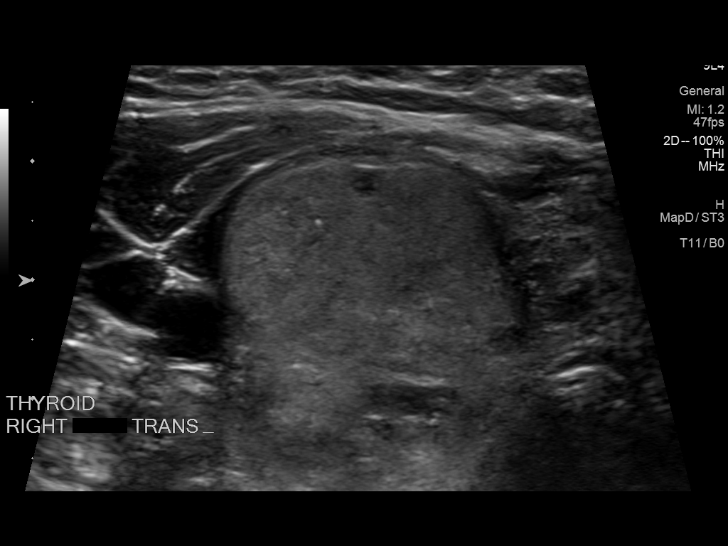
[im 5/17]
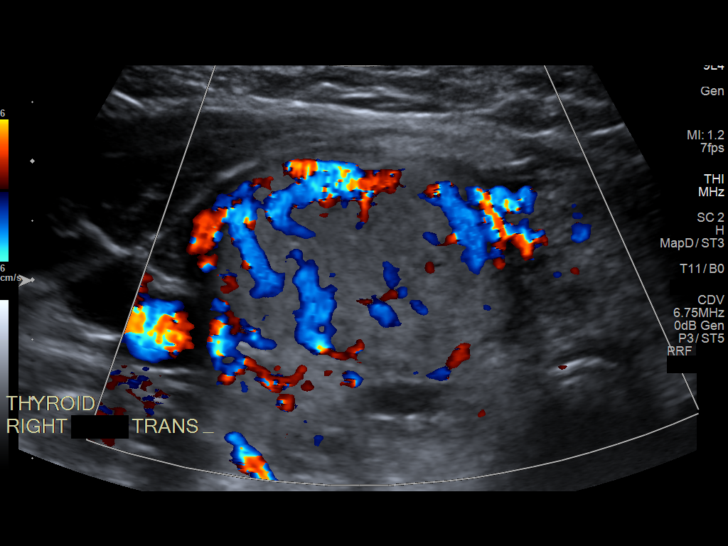
[im 6/17]
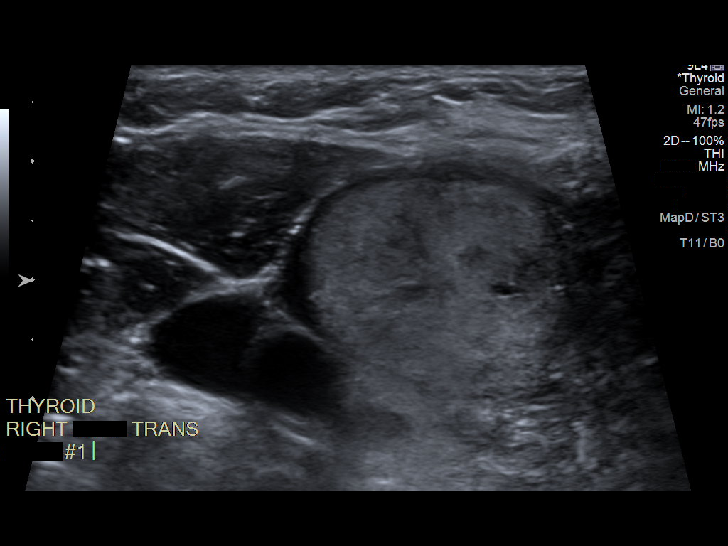
[im 8/17]
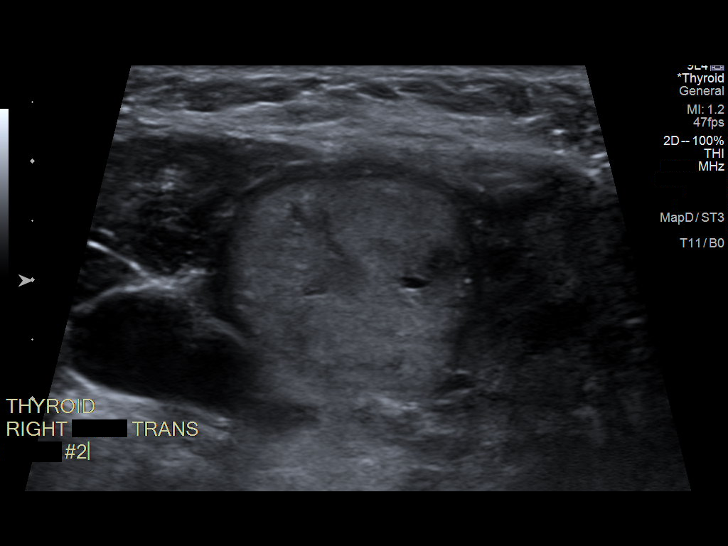
[im 9/17]
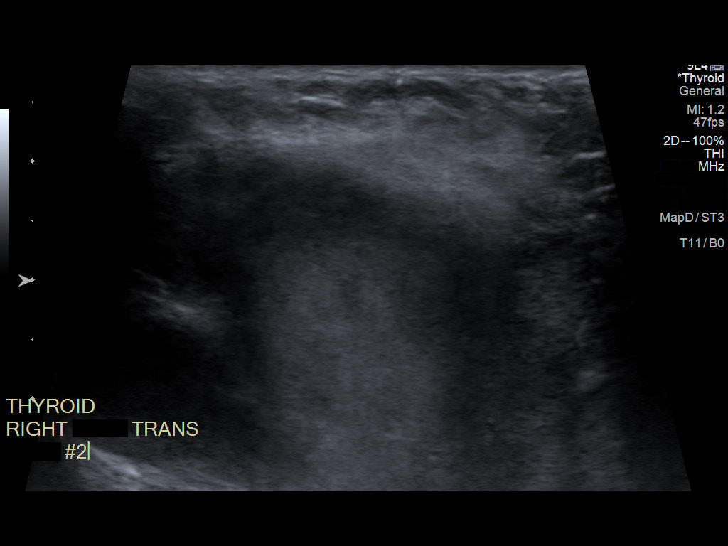
[im 10/17]
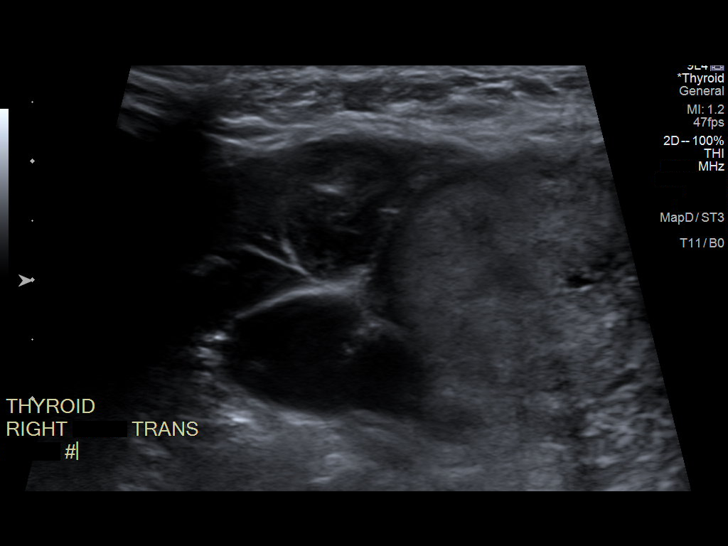
[im 12/17]
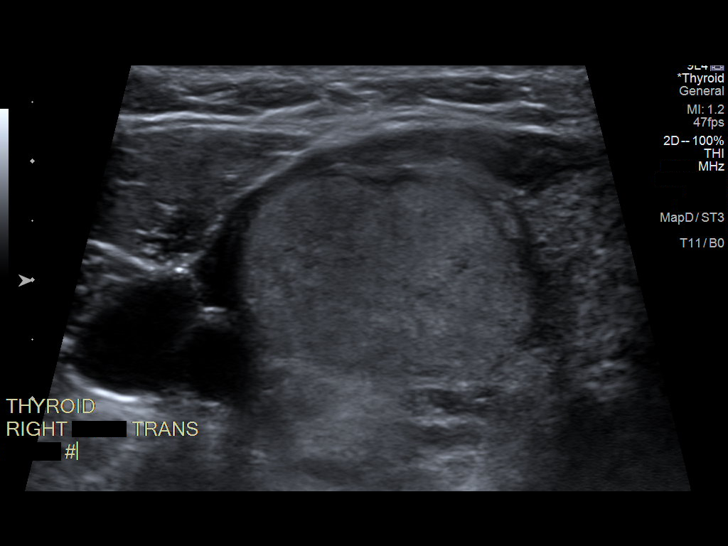
[im 13/17]
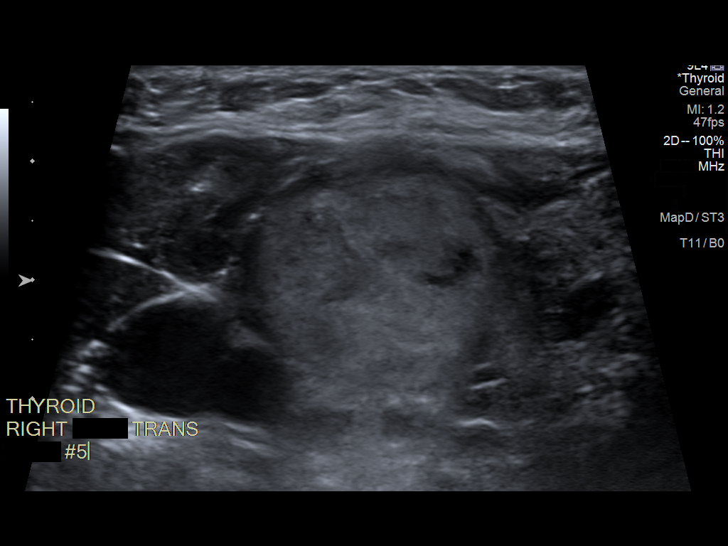
[im 14/17]
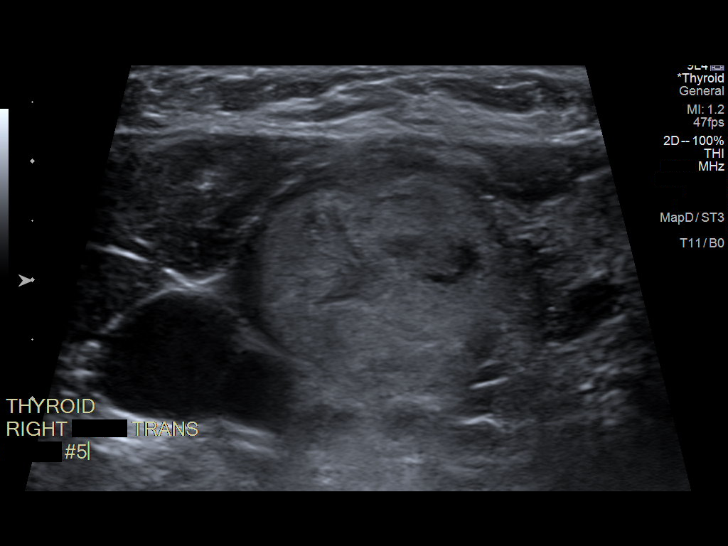
[im 16/17]
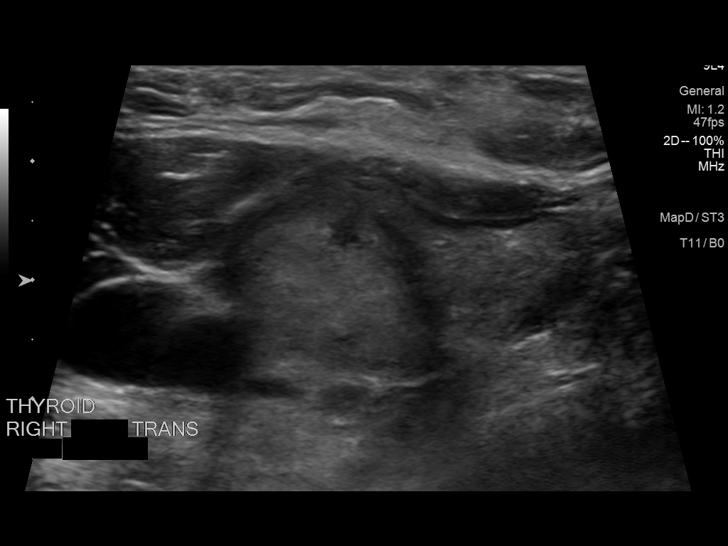
[im 17/17]
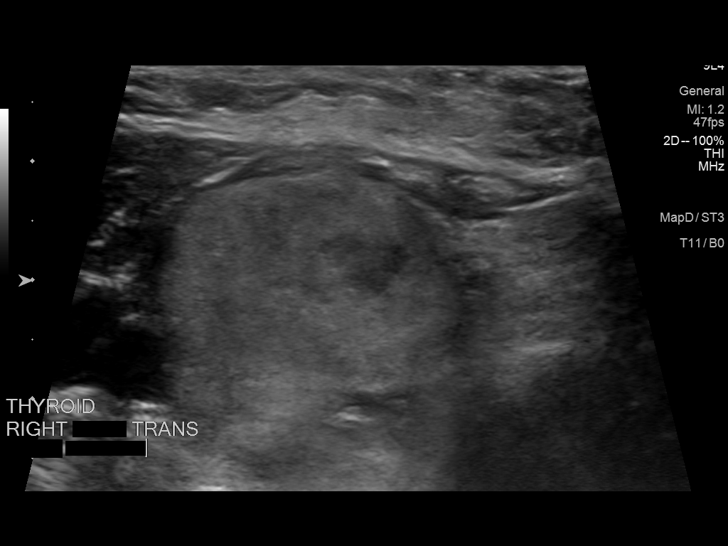

[13 of 17 positions shown; findings below may reference images not displayed]

Pre-procedural ultrasound scanning demonstrated unchanged size and
appearance of the indeterminate nodule within the right mid thyroid.

The procedure was planned. The neck was prepped in the usual sterile
fashion, and a sterile drape was applied covering the operative
field. A timeout was performed prior to the initiation of the
procedure. Local anesthesia was provided with 1% lidocaine.

Under direct ultrasound guidance, 5 FNA biopsies were performed of
the right mid thyroid nodule with a 25 gauge needle. Multiple
ultrasound images were saved for procedural documentation purposes.
The samples were prepared and submitted to pathology. Sample was
also prepared for Afirma testing.

Limited post procedural scanning was negative for hematoma or
additional complication. Dressings were placed. The patient
tolerated the above procedures procedure well without immediate
postprocedural complication.
FINDINGS: Nodule reference number based on prior diagnostic ultrasound: 1

Maximum size: 3.3 cm

Location: Right; Mid

ACR TI-RADS risk category: TR3 (3 points)

Reason for biopsy: meets ACR TI-RADS criteria

Ultrasound imaging confirms appropriate placement of the needles
within the thyroid nodule.
IMPRESSION: Technically successful ultrasound guided fine needle aspiration of
indeterminate right mid thyroid nodule.

## 2022-04-15 ENCOUNTER — Other Ambulatory Visit: Payer: Self-pay | Admitting: Family Medicine

## 2022-05-18 ENCOUNTER — Encounter: Payer: Self-pay | Admitting: Family Medicine

## 2022-05-27 ENCOUNTER — Encounter: Payer: Medicaid Other | Admitting: Family Medicine

## 2022-06-01 ENCOUNTER — Other Ambulatory Visit: Payer: Self-pay | Admitting: Obstetrics and Gynecology

## 2022-06-01 DIAGNOSIS — Z363 Encounter for antenatal screening for malformations: Secondary | ICD-10-CM

## 2022-06-11 ENCOUNTER — Ambulatory Visit: Payer: Medicaid Other

## 2022-06-11 ENCOUNTER — Other Ambulatory Visit: Payer: Medicaid Other

## 2022-07-02 ENCOUNTER — Ambulatory Visit: Payer: Medicaid Other | Attending: Obstetrics and Gynecology | Admitting: *Deleted

## 2022-07-02 ENCOUNTER — Ambulatory Visit (HOSPITAL_BASED_OUTPATIENT_CLINIC_OR_DEPARTMENT_OTHER): Payer: Medicaid Other

## 2022-07-02 ENCOUNTER — Encounter: Payer: Self-pay | Admitting: *Deleted

## 2022-07-02 ENCOUNTER — Other Ambulatory Visit: Payer: Self-pay | Admitting: *Deleted

## 2022-07-02 VITALS — BP 127/72 | HR 94

## 2022-07-02 DIAGNOSIS — Z3A21 21 weeks gestation of pregnancy: Secondary | ICD-10-CM | POA: Diagnosis not present

## 2022-07-02 DIAGNOSIS — O99212 Obesity complicating pregnancy, second trimester: Secondary | ICD-10-CM | POA: Insufficient documentation

## 2022-07-02 DIAGNOSIS — O09292 Supervision of pregnancy with other poor reproductive or obstetric history, second trimester: Secondary | ICD-10-CM | POA: Diagnosis not present

## 2022-07-02 DIAGNOSIS — Z7984 Long term (current) use of oral hypoglycemic drugs: Secondary | ICD-10-CM

## 2022-07-02 DIAGNOSIS — O24112 Pre-existing diabetes mellitus, type 2, in pregnancy, second trimester: Secondary | ICD-10-CM | POA: Diagnosis present

## 2022-07-02 DIAGNOSIS — O321XX0 Maternal care for breech presentation, not applicable or unspecified: Secondary | ICD-10-CM | POA: Insufficient documentation

## 2022-07-02 DIAGNOSIS — Z363 Encounter for antenatal screening for malformations: Secondary | ICD-10-CM | POA: Diagnosis not present

## 2022-07-02 DIAGNOSIS — E669 Obesity, unspecified: Secondary | ICD-10-CM | POA: Diagnosis not present

## 2022-07-02 DIAGNOSIS — O09212 Supervision of pregnancy with history of pre-term labor, second trimester: Secondary | ICD-10-CM

## 2022-07-14 ENCOUNTER — Ambulatory Visit: Payer: Medicaid Other | Admitting: Family Medicine

## 2022-07-30 ENCOUNTER — Ambulatory Visit: Payer: Medicaid Other | Admitting: *Deleted

## 2022-07-30 ENCOUNTER — Ambulatory Visit: Payer: Medicaid Other | Attending: Maternal & Fetal Medicine

## 2022-07-30 ENCOUNTER — Other Ambulatory Visit: Payer: Self-pay | Admitting: *Deleted

## 2022-07-30 VITALS — BP 127/73 | HR 99

## 2022-07-30 DIAGNOSIS — O24112 Pre-existing diabetes mellitus, type 2, in pregnancy, second trimester: Secondary | ICD-10-CM

## 2022-07-30 DIAGNOSIS — Z794 Long term (current) use of insulin: Secondary | ICD-10-CM

## 2022-07-30 DIAGNOSIS — E669 Obesity, unspecified: Secondary | ICD-10-CM

## 2022-07-30 DIAGNOSIS — E119 Type 2 diabetes mellitus without complications: Secondary | ICD-10-CM | POA: Diagnosis not present

## 2022-07-30 DIAGNOSIS — O99212 Obesity complicating pregnancy, second trimester: Secondary | ICD-10-CM

## 2022-07-30 DIAGNOSIS — O09899 Supervision of other high risk pregnancies, unspecified trimester: Secondary | ICD-10-CM

## 2022-07-30 DIAGNOSIS — Z3A25 25 weeks gestation of pregnancy: Secondary | ICD-10-CM

## 2022-07-30 DIAGNOSIS — R638 Other symptoms and signs concerning food and fluid intake: Secondary | ICD-10-CM

## 2022-07-30 DIAGNOSIS — Z7984 Long term (current) use of oral hypoglycemic drugs: Secondary | ICD-10-CM

## 2022-07-30 DIAGNOSIS — O09212 Supervision of pregnancy with history of pre-term labor, second trimester: Secondary | ICD-10-CM

## 2022-08-19 ENCOUNTER — Encounter (HOSPITAL_COMMUNITY): Payer: Self-pay | Admitting: Obstetrics and Gynecology

## 2022-08-19 ENCOUNTER — Inpatient Hospital Stay (HOSPITAL_COMMUNITY)
Admission: AD | Admit: 2022-08-19 | Discharge: 2022-08-20 | Disposition: A | Discharge status: Home / Self Care | Payer: Medicaid Other | Attending: Obstetrics and Gynecology | Admitting: Obstetrics and Gynecology

## 2022-08-19 DIAGNOSIS — R102 Pelvic and perineal pain: Secondary | ICD-10-CM | POA: Diagnosis not present

## 2022-08-19 DIAGNOSIS — O99283 Endocrine, nutritional and metabolic diseases complicating pregnancy, third trimester: Secondary | ICD-10-CM | POA: Insufficient documentation

## 2022-08-19 DIAGNOSIS — E876 Hypokalemia: Secondary | ICD-10-CM | POA: Diagnosis not present

## 2022-08-19 DIAGNOSIS — O219 Vomiting of pregnancy, unspecified: Secondary | ICD-10-CM

## 2022-08-19 DIAGNOSIS — Z3A28 28 weeks gestation of pregnancy: Secondary | ICD-10-CM | POA: Diagnosis not present

## 2022-08-19 DIAGNOSIS — O99891 Other specified diseases and conditions complicating pregnancy: Secondary | ICD-10-CM | POA: Diagnosis not present

## 2022-08-19 DIAGNOSIS — N949 Unspecified condition associated with female genital organs and menstrual cycle: Secondary | ICD-10-CM

## 2022-08-19 LAB — COMPREHENSIVE METABOLIC PANEL
ALT: 10 U/L (ref 0–44)
AST: 15 U/L (ref 15–41)
Albumin: 2.6 g/dL — ABNORMAL LOW (ref 3.5–5.0)
Alkaline Phosphatase: 69 U/L (ref 38–126)
Anion gap: 9 (ref 5–15)
BUN: 5 mg/dL — ABNORMAL LOW (ref 6–20)
CO2: 23 mmol/L (ref 22–32)
Calcium: 8.9 mg/dL (ref 8.9–10.3)
Chloride: 105 mmol/L (ref 98–111)
Creatinine, Ser: 0.69 mg/dL (ref 0.44–1.00)
GFR, Estimated: >60 mL/min (ref >60)
Glucose, Bld: 127 mg/dL — ABNORMAL HIGH (ref 70–99)
Potassium: 3.2 mmol/L — ABNORMAL LOW (ref 3.5–5.1)
Sodium: 137 mmol/L (ref 135–145)
Total Bilirubin: 0.3 mg/dL (ref 0.3–1.2)
Total Protein: 6.5 g/dL (ref 6.5–8.1)

## 2022-08-19 LAB — URINALYSIS, ROUTINE W REFLEX MICROSCOPIC
Bilirubin Urine: NEGATIVE
Glucose, UA: 150 mg/dL — AB
Hgb urine dipstick: NEGATIVE
Ketones, ur: 5 mg/dL — AB
Nitrite: NEGATIVE
Protein, ur: 100 mg/dL — AB
Specific Gravity, Urine: 1.025 (ref 1.005–1.030)
pH: 6 (ref 5.0–8.0)

## 2022-08-19 LAB — CBC WITH DIFFERENTIAL/PLATELET
Abs Immature Granulocytes: 0.02 10*3/uL (ref 0.00–0.07)
Basophils Absolute: 0 10*3/uL (ref 0.0–0.1)
Basophils Relative: 0 %
Eosinophils Absolute: 0.1 10*3/uL (ref 0.0–0.5)
Eosinophils Relative: 1 %
HCT: 26.5 % — ABNORMAL LOW (ref 36.0–46.0)
Hemoglobin: 8.6 g/dL — ABNORMAL LOW (ref 12.0–15.0)
Immature Granulocytes: 0 %
Lymphocytes Relative: 20 %
Lymphs Abs: 1.5 10*3/uL (ref 0.7–4.0)
MCH: 26.6 pg (ref 26.0–34.0)
MCHC: 32.5 g/dL (ref 30.0–36.0)
MCV: 82 fL (ref 80.0–100.0)
Monocytes Absolute: 0.5 10*3/uL (ref 0.1–1.0)
Monocytes Relative: 7 %
Neutro Abs: 5.4 10*3/uL (ref 1.7–7.7)
Neutrophils Relative %: 72 %
Platelets: 222 10*3/uL (ref 150–400)
RBC: 3.23 MIL/uL — ABNORMAL LOW (ref 3.87–5.11)
RDW: 15.1 % (ref 11.5–15.5)
WBC: 7.5 10*3/uL (ref 4.0–10.5)
nRBC: 0 % (ref 0.0–0.2)

## 2022-08-19 LAB — GLUCOSE, CAPILLARY: Glucose-Capillary: 126 mg/dL — ABNORMAL HIGH (ref 70–99)

## 2022-08-19 MED ORDER — POTASSIUM CHLORIDE CRYS ER 20 MEQ PO TBCR
40.0000 meq | EXTENDED_RELEASE_TABLET | Freq: Once | ORAL | Status: AC
  Administered 2022-08-19: 40 meq via ORAL
  Filled 2022-08-19: qty 2

## 2022-08-19 MED ORDER — ONDANSETRON 4 MG PO TBDP
8.0000 mg | ORAL_TABLET | Freq: Once | ORAL | Status: AC
  Administered 2022-08-19: 8 mg via ORAL
  Filled 2022-08-19: qty 2

## 2022-08-19 NOTE — MAU Provider Note (Signed)
History     CSN: [id number]  Arrival date and time: 08/19/22 2143   Event Date/Time   First Provider Initiated Contact with Patient 08/19/22 2209      Chief Complaint  Patient presents with   Abdominal Pain   Nausea   Marilyn Hughes is a 31 y.o. [id number] at [id number] who presents today with nausea/vomiting and abdominal pain. She states that she started feeling nauseous today and has had one episode of dry heaving. She states that for the last couple of weeks her blood sugars have not been good. She states that her fastings have been around 120. She states that she is only on '1000mg'$  BID of metformin currently and has not been started on insulin. PCP wrote RX for insulin in July, but patient never started this medication.   She denies VB or LOF. She reports normal fetal movement.   Pelvic Pain The patient's primary symptoms include pelvic pain. The patient's pertinent negatives include no vaginal bleeding or vaginal discharge. This is a new problem. The current episode started yesterday. The problem occurs intermittently. The problem has been unchanged. The problem affects both sides. She is pregnant. Pertinent negatives include no chills or fever. The vaginal discharge was normal. There has been no bleeding. The symptoms are aggravated by activity. She has tried nothing for the symptoms. Sexual activity: denies intercourse or anything in her vagina in the last 24 hours.    OB History     Gravida  4   Para  2   Term  1   Preterm  1   AB  1   Living  2      SAB      IAB      Ectopic  1   Multiple      Live Births  2           Past Medical History:  Diagnosis Date   Diabetes mellitus without complication (HCC)    Type 2   Vaginal Pap smear, abnormal     Past Surgical History:  Procedure Laterality Date   back sx  2005   scoliosis    HERNIA REPAIR      Family History  Problem Relation Age of Onset   Hypertension Mother    Diabetes Mother     Social  History   Tobacco Use   Smoking status: Never   Smokeless tobacco: Never  Vaping Use   Vaping Use: Never used  Substance Use Topics   Alcohol use: Not Currently   Drug use: Never    Allergies: No Known Allergies  Medications Prior to Admission  Medication Sig Dispense Refill Last Dose   acetaminophen (TYLENOL) 650 MG CR tablet Take 650 mg by mouth every 8 (eight) hours as needed for pain.   08/18/2022 at 1900   metFORMIN (GLUCOPHAGE) 1000 MG tablet Take 1,000 mg by mouth 2 (two) times daily with a meal.   08/19/2022 at 0900   Prenatal Vit-Fe Fumarate-FA (PRENATAL MULTIVITAMIN) TABS tablet Take 1 tablet by mouth daily at 12 noon.   Past Week   Doxylamine-Pyridoxine (DICLEGIS) 10-10 MG TBEC Take 2 tabs at bedtime. If needed, add another tab in the morning. If needed, add another tab in the afternoon, up to 4 tabs/day. (Patient not taking: Reported on 07/02/2022) 100 tablet 0    ferrous sulfate 325 (65 FE) MG tablet TAKE 1 TABLET BY MOUTH EVERY OTHER DAY (Patient not taking: Reported on 07/02/2022) 15 tablet 0  insulin glargine (LANTUS SOLOSTAR) 100 UNIT/ML Solostar Pen Inject 5 Units into the skin daily. (Patient not taking: Reported on 07/02/2022) 15 mL 1    Insulin Pen Needle 31G X 5 MM MISC 1 each by Does not apply route at bedtime. 30 each 5    promethazine (PHENERGAN) 12.5 MG tablet Take 1 tablet (12.5 mg total) by mouth every 6 (six) hours as needed for nausea or vomiting. (Patient not taking: Reported on 07/02/2022) 30 tablet 0     Review of Systems  Constitutional:  Negative for chills and fever.  Genitourinary:  Positive for pelvic pain. Negative for vaginal discharge.  All other systems reviewed and are negative.  Physical Exam   Blood pressure 125/69, pulse 99, temperature 98.7 F (37.1 C), temperature source Oral, resp. rate 16, last menstrual period 01/30/2022, SpO2 100 %.  Physical Exam Constitutional:      Appearance: She is well-developed.  HENT:     Head:  Normocephalic.  Eyes:     Pupils: Pupils are equal, round, and reactive to light.  Cardiovascular:     Rate and Rhythm: Normal rate.  Pulmonary:     Effort: Pulmonary effort is normal. No respiratory distress.  Abdominal:     Palpations: Abdomen is soft.     Tenderness: There is no abdominal tenderness.  Genitourinary:    Vagina: No bleeding. Vaginal discharge: mucusy.    Comments: External: no lesion Vagina: small amount of white discharge     Musculoskeletal:        General: Normal range of motion.     Cervical back: Normal range of motion and neck supple.  Skin:    General: Skin is warm and dry.  Neurological:     Mental Status: She is alert and oriented to person, place, and time.  Psychiatric:        Mood and Affect: Mood normal.        Behavior: Behavior normal.    NST:  Baseline: 145 Variability: moderate Accels: 15x15 Decels: none Toco: none Reactive/Appropriate for GA  Results for orders placed or performed during the hospital encounter of 08/19/22 (from the past 24 hour(s))  Urinalysis, Routine w reflex microscopic Urine, Clean Catch     Status: Abnormal   Collection Time: 08/19/22 10:13 PM  Result Value Ref Range   Color, Urine YELLOW YELLOW   APPearance HAZY (A) CLEAR   Specific Gravity, Urine 1.025 1.005 - 1.030   pH 6.0 5.0 - 8.0   Glucose, UA 150 (A) NEGATIVE mg/dL   Hgb urine dipstick NEGATIVE NEGATIVE   Bilirubin Urine NEGATIVE NEGATIVE   Ketones, ur 5 (A) NEGATIVE mg/dL   Protein, ur 100 (A) NEGATIVE mg/dL   Nitrite NEGATIVE NEGATIVE   Leukocytes,Ua TRACE (A) NEGATIVE   RBC / HPF 0-5 0 - 5 RBC/hpf   WBC, UA 0-5 0 - 5 WBC/hpf   Bacteria, UA RARE (A) NONE SEEN   Squamous Epithelial / LPF 6-10 0 - 5   Mucus PRESENT   Glucose, capillary     Status: Abnormal   Collection Time: 08/19/22 10:33 PM  Result Value Ref Range   Glucose-Capillary 126 (H) 70 - 99 mg/dL  CBC with Differential/Platelet     Status: Abnormal   Collection Time: 08/19/22  10:43 PM  Result Value Ref Range   WBC 7.5 4.0 - 10.5 K/uL   RBC 3.23 (L) 3.87 - 5.11 MIL/uL   Hemoglobin 8.6 (L) 12.0 - 15.0 g/dL   HCT 26.5 (L) 36.0 -  46.0 %   MCV 82.0 80.0 - 100.0 fL   MCH 26.6 26.0 - 34.0 pg   MCHC 32.5 30.0 - 36.0 g/dL   RDW 15.1 11.5 - 15.5 %   Platelets 222 150 - 400 K/uL   nRBC 0.0 0.0 - 0.2 %   Neutrophils Relative % 72 %   Neutro Abs 5.4 1.7 - 7.7 K/uL   Lymphocytes Relative 20 %   Lymphs Abs 1.5 0.7 - 4.0 K/uL   Monocytes Relative 7 %   Monocytes Absolute 0.5 0.1 - 1.0 K/uL   Eosinophils Relative 1 %   Eosinophils Absolute 0.1 0.0 - 0.5 K/uL   Basophils Relative 0 %   Basophils Absolute 0.0 0.0 - 0.1 K/uL   Immature Granulocytes 0 %   Abs Immature Granulocytes 0.02 0.00 - 0.07 K/uL  Comprehensive metabolic panel     Status: Abnormal   Collection Time: 08/19/22 10:43 PM  Result Value Ref Range   Sodium 137 135 - 145 mmol/L   Potassium 3.2 (L) 3.5 - 5.1 mmol/L   Chloride 105 98 - 111 mmol/L   CO2 23 22 - 32 mmol/L   Glucose, Bld 127 (H) 70 - 99 mg/dL   BUN 5 (L) 6 - 20 mg/dL   Creatinine, Ser 0.69 0.44 - 1.00 mg/dL   Calcium 8.9 8.9 - 10.3 mg/dL   Total Protein 6.5 6.5 - 8.1 g/dL   Albumin 2.6 (L) 3.5 - 5.0 g/dL   AST 15 15 - 41 U/L   ALT 10 0 - 44 U/L   Alkaline Phosphatase 69 38 - 126 U/L   Total Bilirubin 0.3 0.3 - 1.2 mg/dL   GFR, Estimated >60 >60 mL/min   Anion gap 9 5 - 15      MAU Course  Procedures  MDM  Patient has had '8mg'$  zofran. She is feeling better and is tolerating PO. She was also given 51mQ of potassium due to K of 3.2. She has tolerated that without further emesis. Will DC home with meds for nausea. FU in the office as planned.   Assessment and Plan   1. Nausea/vomiting in pregnancy   2. [redacted] weeks gestation of pregnancy   3. Round ligament pain   4. Hypokalemia    DC home in stable condition  3rd Trimester precautions  PTL precautions  Fetal kick counts RX: Zofran '4mg'$  ODT PRN #20  Return to MAU as  needed FU with OB as planned   [facility].   Specialty: Obstetrics and Gynecology Contact information: [id number] ShoalsDNP, CNM  08/20/22  12:40 AM

## 2022-08-19 NOTE — MAU Note (Addendum)
Marilyn Hughes is a 31 y.o. at 11w5dhere in MAU reporting: by EMS reporting abdominal/pelvis pain that began this morning at 0900. Pt states the pain begins in her pelvis and radiates up to her upper left abdomen. Pt states her stomach gets tight after this pain. Pt states she is still feeling the stomach pain and it got worse tonight. Pt states she also felt dizzy tonight. Pt complains of N/V tonight. Pt has vomited once at 2000. Pt denies VB or LOF. +FM   Onset of complaint: 0900 Pain score: 7/10 Vitals:   08/19/22 2159  BP: 125/69  Pulse: 99  Resp: 16  Temp: 98.7 F (37.1 C)  SpO2: 100%     FHT:144 Lab orders placed from triage:  UA

## 2022-08-20 DIAGNOSIS — O219 Vomiting of pregnancy, unspecified: Secondary | ICD-10-CM

## 2022-08-20 DIAGNOSIS — Z3A28 28 weeks gestation of pregnancy: Secondary | ICD-10-CM

## 2022-08-20 MED ORDER — ONDANSETRON 4 MG PO TBDP: 4.0000 mg | ORAL_TABLET | Freq: Three times a day (TID) | ORAL | 0 refills | Status: DC | PRN

## 2022-08-20 NOTE — Discharge Instructions (Signed)
Round Ligament Pain during Pregnancy Many women will experience a type of pain referred to as "round ligament pain" during their pregnancy. This is associated with abdominal pain or discomfort. Since any type of abdominal pain during pregnancy can be disconcerting, it is important to talk about round ligament pain to relieve any anxiety or fears you may have regarding the symptoms you are feeling. Round ligament pain is due to normal changes that take place in the body during pregnancy. It is caused by stretching of the round ligaments attached to the uterus. More commonly it occurs on the right side of the pelvis. Round Ligament: An Overview Typically in the non-pregnant state the uterus is about the size of an apple or pear. There are thick ligaments which hold the uterus in place in the abdomen, referred to as round ligaments. During pregnancy, your uterus will expand in size and weight, and the ligaments supporting it will have to stretch, becoming longer and thinner. As these ligaments pull and tug they may irritate nearby nerve fibers, which causes pain. The severity of the pain in some cases can seem extreme. Some common symptoms of round ligament pain include:  Ligament spasms or contractions/cramps that trigger a sharp pain typically on the right side of the abdomen.  Pain upon waking or suddenly rolling over in your sleep.  Pain in the abdomen that is sharp brought on by exercise or other vigorous activity. Similar Problems Round ligament pain is often mistaken for other medical conditions because the symptoms are similar. Acute abdominal pain during pregnancy may also be a sign of other conditions including:  Abdominal cramps - Some abdominal pain is simply caused by change in bowel habits associated with pregnancy. Gas is a common problem that can cause sharp, shooting pain. You should always seek out medical care if your pain is accompanied by fever, chills, pain  upon urination or if you have difficulty walking. Further exams and tests will be conducted to ensure that you do not have a more serious condition. It is not uncommon for women with lower abdominal pain to have a urinary tract infection, thus you may also be asked for a urine sample. Treatment If all other conditions are ruled out you can treat your round ligament pain relatively easily. You may be advised to take some acetaminophen (Tylenol) to reduce the severity of any persistent pain and asked to reduce your activity level. You can apply a heating pad to the area of pain or take a warm bath. Lying on the opposite side of the pain may help as well. Most women will find relief from round ligament pain simply by altering their daily routines slightly. The good news is round ligament pain will disappear completely once you have given birth to your child!

## 2022-08-25 ENCOUNTER — Other Ambulatory Visit: Payer: Self-pay | Admitting: Obstetrics and Gynecology

## 2022-08-27 ENCOUNTER — Ambulatory Visit: Payer: Medicaid Other

## 2022-08-30 NOTE — L&D Delivery Note (Signed)
Delivery Note At 10:23 PM a viable female was delivered via Vaginal, Spontaneous (Presentation:   Occiput Anterior). Nuchal cord x 1 reduced.  APGAR:8 at 1 minute and 9 at five minutes  , ; weight  pending.   Placenta status: Spontaneous, Intact.  Cord: 3 vessels with the following complications: None.  Cord pH: NA  Anesthesia: Epidural Episiotomy: None Lacerations: 1st degree;Vaginal Suture Repair: 3.0 vicryl Est. Blood Loss (mL): 75  Mom to postpartum.  Baby to Couplet care / Skin to Skin.  Christophe Louis 10/09/2022, 10:47 PM

## 2022-09-13 ENCOUNTER — Ambulatory Visit: Payer: Medicaid Other | Admitting: *Deleted

## 2022-09-13 ENCOUNTER — Other Ambulatory Visit: Payer: Self-pay | Admitting: *Deleted

## 2022-09-13 ENCOUNTER — Ambulatory Visit: Payer: Medicaid Other | Attending: Obstetrics and Gynecology

## 2022-09-13 VITALS — BP 139/84 | HR 99

## 2022-09-13 DIAGNOSIS — O99213 Obesity complicating pregnancy, third trimester: Secondary | ICD-10-CM

## 2022-09-13 DIAGNOSIS — Z7984 Long term (current) use of oral hypoglycemic drugs: Secondary | ICD-10-CM

## 2022-09-13 DIAGNOSIS — Z3A32 32 weeks gestation of pregnancy: Secondary | ICD-10-CM

## 2022-09-13 DIAGNOSIS — R638 Other symptoms and signs concerning food and fluid intake: Secondary | ICD-10-CM | POA: Diagnosis present

## 2022-09-13 DIAGNOSIS — O09899 Supervision of other high risk pregnancies, unspecified trimester: Secondary | ICD-10-CM

## 2022-09-13 DIAGNOSIS — E119 Type 2 diabetes mellitus without complications: Secondary | ICD-10-CM | POA: Diagnosis not present

## 2022-09-13 DIAGNOSIS — O99212 Obesity complicating pregnancy, second trimester: Secondary | ICD-10-CM | POA: Diagnosis present

## 2022-09-13 DIAGNOSIS — O24112 Pre-existing diabetes mellitus, type 2, in pregnancy, second trimester: Secondary | ICD-10-CM | POA: Diagnosis not present

## 2022-09-13 DIAGNOSIS — O24113 Pre-existing diabetes mellitus, type 2, in pregnancy, third trimester: Secondary | ICD-10-CM

## 2022-09-13 DIAGNOSIS — E669 Obesity, unspecified: Secondary | ICD-10-CM

## 2022-09-13 DIAGNOSIS — O09213 Supervision of pregnancy with history of pre-term labor, third trimester: Secondary | ICD-10-CM

## 2022-09-20 ENCOUNTER — Ambulatory Visit: Payer: Medicaid Other | Attending: Obstetrics and Gynecology

## 2022-09-20 ENCOUNTER — Ambulatory Visit: Payer: Medicaid Other

## 2022-09-27 ENCOUNTER — Ambulatory Visit: Payer: Medicaid Other | Attending: Maternal & Fetal Medicine

## 2022-09-27 ENCOUNTER — Ambulatory Visit: Payer: Medicaid Other | Admitting: *Deleted

## 2022-09-27 VITALS — BP 132/75 | HR 78

## 2022-09-27 DIAGNOSIS — O09213 Supervision of pregnancy with history of pre-term labor, third trimester: Secondary | ICD-10-CM

## 2022-09-27 DIAGNOSIS — O99213 Obesity complicating pregnancy, third trimester: Secondary | ICD-10-CM | POA: Diagnosis not present

## 2022-09-27 DIAGNOSIS — E669 Obesity, unspecified: Secondary | ICD-10-CM | POA: Diagnosis not present

## 2022-09-27 DIAGNOSIS — Z362 Encounter for other antenatal screening follow-up: Secondary | ICD-10-CM

## 2022-09-27 DIAGNOSIS — O24113 Pre-existing diabetes mellitus, type 2, in pregnancy, third trimester: Secondary | ICD-10-CM | POA: Diagnosis not present

## 2022-09-27 DIAGNOSIS — O09899 Supervision of other high risk pregnancies, unspecified trimester: Secondary | ICD-10-CM | POA: Insufficient documentation

## 2022-09-27 DIAGNOSIS — Z3A34 34 weeks gestation of pregnancy: Secondary | ICD-10-CM

## 2022-10-04 ENCOUNTER — Ambulatory Visit: Payer: Medicaid Other | Attending: Maternal & Fetal Medicine

## 2022-10-04 ENCOUNTER — Ambulatory Visit: Payer: Medicaid Other

## 2022-10-08 ENCOUNTER — Inpatient Hospital Stay (HOSPITAL_COMMUNITY)
Admission: AD | Admit: 2022-10-08 | Discharge: 2022-10-12 | DRG: 807 | Disposition: A | Discharge status: Home / Self Care | Payer: Medicaid Other | Attending: Obstetrics and Gynecology | Admitting: Obstetrics and Gynecology

## 2022-10-08 ENCOUNTER — Other Ambulatory Visit: Payer: Self-pay

## 2022-10-08 ENCOUNTER — Encounter (HOSPITAL_COMMUNITY): Payer: Self-pay | Admitting: *Deleted

## 2022-10-08 DIAGNOSIS — Z7982 Long term (current) use of aspirin: Secondary | ICD-10-CM | POA: Diagnosis not present

## 2022-10-08 DIAGNOSIS — Z7984 Long term (current) use of oral hypoglycemic drugs: Secondary | ICD-10-CM

## 2022-10-08 DIAGNOSIS — O1414 Severe pre-eclampsia complicating childbirth: Secondary | ICD-10-CM | POA: Diagnosis present

## 2022-10-08 DIAGNOSIS — O99284 Endocrine, nutritional and metabolic diseases complicating childbirth: Secondary | ICD-10-CM | POA: Diagnosis present

## 2022-10-08 DIAGNOSIS — O2412 Pre-existing diabetes mellitus, type 2, in childbirth: Secondary | ICD-10-CM | POA: Diagnosis present

## 2022-10-08 DIAGNOSIS — E876 Hypokalemia: Secondary | ICD-10-CM | POA: Diagnosis present

## 2022-10-08 DIAGNOSIS — O24119 Pre-existing diabetes mellitus, type 2, in pregnancy, unspecified trimester: Secondary | ICD-10-CM | POA: Diagnosis present

## 2022-10-08 DIAGNOSIS — Z3490 Encounter for supervision of normal pregnancy, unspecified, unspecified trimester: Secondary | ICD-10-CM | POA: Diagnosis present

## 2022-10-08 DIAGNOSIS — D509 Iron deficiency anemia, unspecified: Secondary | ICD-10-CM | POA: Diagnosis present

## 2022-10-08 DIAGNOSIS — Z794 Long term (current) use of insulin: Secondary | ICD-10-CM

## 2022-10-08 DIAGNOSIS — O09293 Supervision of pregnancy with other poor reproductive or obstetric history, third trimester: Secondary | ICD-10-CM

## 2022-10-08 DIAGNOSIS — O9902 Anemia complicating childbirth: Secondary | ICD-10-CM | POA: Diagnosis present

## 2022-10-08 DIAGNOSIS — E119 Type 2 diabetes mellitus without complications: Secondary | ICD-10-CM | POA: Diagnosis present

## 2022-10-08 DIAGNOSIS — R030 Elevated blood-pressure reading, without diagnosis of hypertension: Secondary | ICD-10-CM | POA: Diagnosis present

## 2022-10-08 DIAGNOSIS — O99214 Obesity complicating childbirth: Secondary | ICD-10-CM | POA: Diagnosis present

## 2022-10-08 DIAGNOSIS — Z3A36 36 weeks gestation of pregnancy: Secondary | ICD-10-CM | POA: Diagnosis not present

## 2022-10-08 DIAGNOSIS — O1410 Severe pre-eclampsia, unspecified trimester: Secondary | ICD-10-CM | POA: Diagnosis present

## 2022-10-08 DIAGNOSIS — O1413 Severe pre-eclampsia, third trimester: Principal | ICD-10-CM | POA: Diagnosis present

## 2022-10-08 DIAGNOSIS — O99019 Anemia complicating pregnancy, unspecified trimester: Secondary | ICD-10-CM | POA: Diagnosis present

## 2022-10-08 HISTORY — DX: Gestational (pregnancy-induced) hypertension without significant proteinuria, unspecified trimester: O13.9

## 2022-10-08 LAB — PROTEIN / CREATININE RATIO, URINE
Creatinine, Urine: 31 mg/dL
Protein Creatinine Ratio: 1.81 mg/mg{Cre} — ABNORMAL HIGH (ref 0.00–0.15)
Total Protein, Urine: 56 mg/dL

## 2022-10-08 LAB — CBC
HCT: 24.3 % — ABNORMAL LOW (ref 36.0–46.0)
Hemoglobin: 7.8 g/dL — ABNORMAL LOW (ref 12.0–15.0)
MCH: 24.5 pg — ABNORMAL LOW (ref 26.0–34.0)
MCHC: 32.1 g/dL (ref 30.0–36.0)
MCV: 76.4 fL — ABNORMAL LOW (ref 80.0–100.0)
Platelets: 180 10*3/uL (ref 150–400)
RBC: 3.18 MIL/uL — ABNORMAL LOW (ref 3.87–5.11)
RDW: 16 % — ABNORMAL HIGH (ref 11.5–15.5)
WBC: 7.2 10*3/uL (ref 4.0–10.5)
nRBC: 0.3 % — ABNORMAL HIGH (ref 0.0–0.2)

## 2022-10-08 LAB — TYPE AND SCREEN
ABO/RH(D): A POS
Antibody Screen: NEGATIVE

## 2022-10-08 LAB — COMPREHENSIVE METABOLIC PANEL
ALT: 8 U/L (ref 0–44)
AST: 14 U/L — ABNORMAL LOW (ref 15–41)
Albumin: 2.4 g/dL — ABNORMAL LOW (ref 3.5–5.0)
Alkaline Phosphatase: 121 U/L (ref 38–126)
Anion gap: 8 (ref 5–15)
BUN: 6 mg/dL (ref 6–20)
CO2: 19 mmol/L — ABNORMAL LOW (ref 22–32)
Calcium: 8.3 mg/dL — ABNORMAL LOW (ref 8.9–10.3)
Chloride: 109 mmol/L (ref 98–111)
Creatinine, Ser: 0.6 mg/dL (ref 0.44–1.00)
GFR, Estimated: >60 mL/min (ref >60)
Glucose, Bld: 92 mg/dL (ref 70–99)
Potassium: 3.3 mmol/L — ABNORMAL LOW (ref 3.5–5.1)
Sodium: 136 mmol/L (ref 135–145)
Total Bilirubin: 0.6 mg/dL (ref 0.3–1.2)
Total Protein: 6.1 g/dL — ABNORMAL LOW (ref 6.5–8.1)

## 2022-10-08 LAB — GROUP B STREP BY PCR: Group B strep by PCR: NEGATIVE

## 2022-10-08 LAB — GLUCOSE, CAPILLARY: Glucose-Capillary: 90 mg/dL (ref 70–99)

## 2022-10-08 MED ORDER — LABETALOL HCL 5 MG/ML IV SOLN: 40.0000 mg | INTRAVENOUS | Status: DC | PRN

## 2022-10-08 MED ORDER — HYDRALAZINE HCL 20 MG/ML IJ SOLN: 10.0000 mg | INTRAMUSCULAR | Status: DC | PRN

## 2022-10-08 MED ORDER — OXYCODONE-ACETAMINOPHEN 5-325 MG PO TABS: 2.0000 | ORAL_TABLET | ORAL | Status: DC | PRN

## 2022-10-08 MED ORDER — LACTATED RINGERS IV SOLN: 500.0000 mL | INTRAVENOUS | Status: DC | PRN

## 2022-10-08 MED ORDER — MAGNESIUM SULFATE BOLUS VIA INFUSION
4.0000 g | Freq: Once | INTRAVENOUS | Status: AC
  Administered 2022-10-08: 4 g via INTRAVENOUS
  Filled 2022-10-08: qty 1000

## 2022-10-08 MED ORDER — LABETALOL HCL 5 MG/ML IV SOLN: 20.0000 mg | INTRAVENOUS | Status: DC | PRN

## 2022-10-08 MED ORDER — DEXTROSE IN LACTATED RINGERS 5 % IV SOLN: INTRAVENOUS | Status: DC

## 2022-10-08 MED ORDER — DEXTROSE 50 % IV SOLN: 0.0000 mL | INTRAVENOUS | Status: DC | PRN

## 2022-10-08 MED ORDER — INSULIN REGULAR(HUMAN) IN NACL 100-0.9 UT/100ML-% IV SOLN: INTRAVENOUS | Status: DC

## 2022-10-08 MED ORDER — MAGNESIUM SULFATE 40 GM/1000ML IV SOLN
2.0000 g/h | INTRAVENOUS | Status: AC
  Administered 2022-10-08 – 2022-10-10 (×3): 2 g/h via INTRAVENOUS
  Filled 2022-10-08 (×3): qty 1000

## 2022-10-08 MED ORDER — ONDANSETRON HCL 4 MG/2ML IJ SOLN
4.0000 mg | Freq: Four times a day (QID) | INTRAMUSCULAR | Status: DC | PRN
  Administered 2022-10-09: 4 mg via INTRAVENOUS
  Filled 2022-10-08: qty 2

## 2022-10-08 MED ORDER — LACTATED RINGERS IV SOLN: INTRAVENOUS | Status: DC

## 2022-10-08 MED ORDER — SOD CITRATE-CITRIC ACID 500-334 MG/5ML PO SOLN: 30.0000 mL | ORAL | Status: DC | PRN

## 2022-10-08 MED ORDER — ACETAMINOPHEN-CAFFEINE 500-65 MG PO TABS
2.0000 | ORAL_TABLET | Freq: Once | ORAL | Status: AC
  Administered 2022-10-08: 2 via ORAL
  Filled 2022-10-08: qty 2

## 2022-10-08 MED ORDER — HYDRALAZINE HCL 20 MG/ML IJ SOLN
5.0000 mg | INTRAMUSCULAR | Status: DC | PRN
  Administered 2022-10-08: 5 mg via INTRAVENOUS
  Filled 2022-10-08: qty 1

## 2022-10-08 MED ORDER — OXYTOCIN BOLUS FROM INFUSION
333.0000 mL | Freq: Once | INTRAVENOUS | Status: AC
  Administered 2022-10-09: 333 mL via INTRAVENOUS

## 2022-10-08 MED ORDER — OXYCODONE-ACETAMINOPHEN 5-325 MG PO TABS: 1.0000 | ORAL_TABLET | ORAL | Status: DC | PRN

## 2022-10-08 MED ORDER — HYDRALAZINE HCL 20 MG/ML IJ SOLN: 5.0000 mg | INTRAMUSCULAR | Status: DC | PRN

## 2022-10-08 MED ORDER — FLEET ENEMA 7-19 GM/118ML RE ENEM: 1.0000 | ENEMA | RECTAL | Status: DC | PRN

## 2022-10-08 MED ORDER — ACETAMINOPHEN 325 MG PO TABS
650.0000 mg | ORAL_TABLET | ORAL | Status: DC | PRN
  Administered 2022-10-09: 650 mg via ORAL
  Filled 2022-10-08: qty 2

## 2022-10-08 MED ORDER — OXYTOCIN-SODIUM CHLORIDE 30-0.9 UT/500ML-% IV SOLN: 2.5000 [IU]/h | INTRAVENOUS | Status: DC

## 2022-10-08 MED ORDER — LACTATED RINGERS IV SOLN
INTRAVENOUS | Status: DC
  Administered 2022-10-09: 75 mL/h via INTRAVENOUS

## 2022-10-08 MED ORDER — LIDOCAINE HCL (PF) 1 % IJ SOLN: 30.0000 mL | INTRAMUSCULAR | Status: DC | PRN

## 2022-10-08 NOTE — H&P (Cosign Needed Addendum)
OB ADMISSION/ HISTORY & PHYSICAL:  Admission Date: 10/08/2022  7:56 PM  Admit Diagnosis: Pre-eclampsia with severe features  Marilyn Hughes is a 32 y.o. female [id number] 7w6dpresenting for elevated blood pressure. Pt came to MAU via EMS reporting headache all day with blurry vision and a feeling of fullness in her face/head. Also having high BP - was 180s/100s at home with similar with EMS. Reports RUQ pain, states it "feels like something pushing on my ribs on this side." Endorses active FM, denies LOF and vaginal bleeding. Does not perceive contractions.  Pregnancy complicated by pre-pregnancy diabetes managed on Metformin 1000 mg BID and Lantus 5 U daily. Hx of pre-eclampsia in x2. HA relieved w/ Tylenol in MAU and BP responded to Hydralazine in MAU.   History of current pregnancy: [id number]  Patient entered care with [facility] at 13+5 wks.   EDC 11/06/22 by LMP and congruent w/ 14+6 wk U/S.   Anatomy scan:  20 wks, complete w/ anterior placenta.   Antenatal testing: for T2DM started at 32 weeks Last evaluation: 34+2 wks vtx, anterior placenta, AFI 17.04, 62%ile, BPP 8/8   Significant prenatal events:  Patient Active Problem List   Diagnosis Date Noted   History of pre-eclampsia in prior pregnancy, currently pregnant in third trimester 10/08/2022   Pre-existing type 2 diabetes mellitus in pregnancy 10/08/2022   Anemia of mother in pregnancy 10/08/2022   Preeclampsia, severe, third trimester 10/08/2022    Prenatal Labs: ABO, Rh: --/--/A POS (02/09 2110) Antibody: NEG (02/09 2110) Rubella:   immune RPR:   NR HBsAg:   NR HIV:   NR GTT: N/A, pre-pregnancy diabetic GBS: NEGATIVE/-- (02/09 2149)  GC/CHL: neg/neg Genetics: low-risk female and Horizon neg Vaccines: Tdap: declined Influenza: declined   OB History  Gravida Para Term Preterm AB Living  '4 2 1 1 1 2  '$ SAB IAB Ectopic Multiple Live Births      1   2    # Outcome Date GA Lbr Len/2nd Weight Sex Delivery Anes PTL Lv  4 Current            3 Preterm 06/27/19    M Vag-Spont   LIV  2 Ectopic 2015          1 Term 02/28/12    M Vag-Spont   LIV    Medical / Surgical History: Past medical history:  Past Medical History:  Diagnosis Date   [place]    Type 2   Pregnancy induced hypertension    Vaginal Pap smear, abnormal     Past surgical history:  Past Surgical History:  Procedure Laterality Date   back sx  2005   scoliosis    HERNIA REPAIR     Family History:  Family History  Problem Relation Age of Onset   Hypertension Mother    Diabetes Mother     Social History:  reports that she has never smoked. She has never used smokeless tobacco. She reports that she does not currently use alcohol. She reports that she does not use drugs.  Allergies: Patient has no known allergies.   Current Medications at time of admission:  Prior to Admission medications   Medication Sig Start Date End Date Taking? Authorizing Provider  aspirin 81 MG chewable tablet Chew 81 mg by mouth daily.   Yes [provider]  metFORMIN (GLUCOPHAGE) 1000 MG tablet Take 1,000 mg by mouth 2 (two) times daily with a meal.   Yes [provider]  Prenatal Vit-Fe Fumarate-FA (PRENATAL MULTIVITAMIN) TABS tablet Take 1 tablet by mouth daily at 12 noon.   Yes [provider]  acetaminophen (TYLENOL) 650 MG CR tablet Take 650 mg by mouth every 8 (eight) hours as needed for pain.    [provider]  Doxylamine-Pyridoxine (DICLEGIS) 10-10 MG TBEC Take 2 tabs at bedtime. If needed, add another tab in the morning. If needed, add another tab in the afternoon, up to 4 tabs/day. Patient not taking: Reported on 07/02/2022 03/05/22   Patriciaann Clan, DO  ferrous sulfate 325 (65 FE) MG tablet TAKE 1 TABLET BY MOUTH EVERY OTHER DAY Patient not taking: Reported on 07/02/2022 03/30/22   Donnamae Jude, MD  insulin glargine (LANTUS SOLOSTAR) 100 UNIT/ML Solostar Pen Inject 5 Units into the skin  daily. Patient not taking: Reported on 07/02/2022 03/05/22   Charlott Rakes, MD  Insulin Pen Needle 31G X 5 MM MISC 1 each by Does not apply route at bedtime. 03/05/22   Charlott Rakes, MD  ondansetron (ZOFRAN-ODT) 4 MG disintegrating tablet Take 1 tablet (4 mg total) by mouth every 8 (eight) hours as needed for nausea or vomiting. Patient not taking: Reported on 09/13/2022 08/20/22   Tresea Mall, CNM  promethazine (PHENERGAN) 12.5 MG tablet Take 1 tablet (12.5 mg total) by mouth every 6 (six) hours as needed for nausea or vomiting. Patient not taking: Reported on 07/02/2022 03/28/22   Nugent, Gerrie Nordmann, NP    Review of Systems: Constitutional: Negative   HENT: Negative   Eyes: Negative   Respiratory: Negative   Cardiovascular: Negative   Gastrointestinal: Negative  Genitourinary: neg for bloody show, neg for LOF   Musculoskeletal: Negative   Skin: Negative   Neurological: Negative   Endo/Heme/Allergies: Negative   Psychiatric/Behavioral: Negative    Physical Exam: VS: Blood pressure (!) 148/86, pulse 69, temperature 98.3 F (36.8 C), resp. rate 18, height '5\' 4"'$  (1.626 m), weight 86.2 kg, last menstrual period 01/30/2022. General: AAO x3 Cardiovascular: RRR Respiratory: Unlabored GU/GI: Abdomen gravid, non-tender, non-distended, active FM, vertex, EFW 5# per Leopold's Extremities: trace edema, negative for pain, tenderness, and cords Neuro: reflexes +2, clonus neg  Cervical exam:Dilation: Fingertip Effacement (%): Thick Station: -3 Exam by:: Leanord Hawking, CNM FHR: baseline rate 135 / variability moderate / accelerations present / absent decelerations TOCO: occasional   Prenatal Transfer Tool  Maternal Diabetes: Yes:  Diabetes Type:  Pre-pregnancy Genetic Screening: Normal Maternal Ultrasounds/Referrals: Normal Fetal Ultrasounds or other Referrals:  Referred to Materal Fetal Medicine  Maternal Substance Abuse:  No Significant Maternal Medications:  Meds include: Other:   Metformin Significant Maternal Lab Results: Group B Strep negative Number of Prenatal Visits:greater than 3 verified prenatal visits Other Comments:   Hx of pre-eclampsia in 2 prev pregnancies    Assessment: 32 y.o. [id number] 12w6dPre-eclampsia with severe features    -severe range BP    -HA, visual disturbances, and epigastric pain    -protein creatinine ratio 1.81 Pre-pregnancy insulin dependent diabetes    -clear liquid diet    -CBG Q 4 hrs    -initiate endotool for 2 consecutive CBG values 120 or greater Cervical ripening - Misoprostol 50 mcg buccal 25 mcg vaginal  FHR category 1 GBS neg Pain management plan: epidural   Plan:  Admit to L&D Routine admission orders Epidural PRN Start Magnesium sulfate 4G load, then 2G/hr Dr CLandry Mellownotified by MAU of arrival and plan of care developed with MD  VArrie EasternDNP, CNM 10/08/2022 11:14  PM   

## 2022-10-08 NOTE — MAU Note (Signed)

## 2022-10-08 NOTE — MAU Provider Note (Signed)
Chief Complaint:  Hypertension and Headache   Event Date/Time   First Provider Initiated Contact with Patient 10/08/22 2025     HPI: Marilyn Hughes is a 32 y.o. [id number] at 57w6dwho presents to maternity admissions reporting headache all day with blurry vision and a feeling of fullness in her face/head. Also having high BP - was 180s/100s at home with similar with EMS. Reports RUQ pain, states it "feels like something pushing on my ribs on this side."  Denies vaginal bleeding, leaking of fluid, decreased fetal movement, fever, falls, or recent illness.   Pregnancy Course: [facility] Has preexisting DM2 (insulin), had an elevated BP in MAU at [redacted]wks pregnant. Has a history of PEC with her two prior pregnancies/vaginal deliveries.  Past Medical History:  Diagnosis Date   Diabetes mellitus without complication ([facility]    Type 2   Pregnancy induced hypertension    Vaginal Pap smear, abnormal    OB History  Gravida Para Term Preterm AB Living  4 2 1 1 1 2  $ SAB IAB Ectopic Multiple Live Births      1   2    # Outcome Date GA Lbr Len/2nd Weight Sex Delivery Anes PTL Lv  4 Current           3 Preterm 06/27/19    M Vag-Spont   LIV  2 Ectopic 2015          1 Term 02/28/12    M Vag-Spont   LIV   Past Surgical History:  Procedure Laterality Date   back sx  2005   scoliosis    HERNIA REPAIR     Family History  Problem Relation Age of Onset   Hypertension Mother    Diabetes Mother    Social History   Tobacco Use   Smoking status: Never   Smokeless tobacco: Never  Vaping Use   Vaping Use: Never used  Substance Use Topics   Alcohol use: Not Currently   Drug use: Never   No Known Allergies Medications Prior to Admission  Medication Sig Dispense Refill Last Dose   aspirin 81 MG chewable tablet Chew 81 mg by mouth daily.   10/07/2022   metFORMIN (GLUCOPHAGE) 1000 MG tablet Take 1,000 mg by mouth 2 (two) times daily with a meal.   10/08/2022   Prenatal Vit-Fe  Fumarate-FA (PRENATAL MULTIVITAMIN) TABS tablet Take 1 tablet by mouth daily at 12 noon.   10/08/2022   acetaminophen (TYLENOL) 650 MG CR tablet Take 650 mg by mouth every 8 (eight) hours as needed for pain.      Doxylamine-Pyridoxine (DICLEGIS) 10-10 MG TBEC Take 2 tabs at bedtime. If needed, add another tab in the morning. If needed, add another tab in the afternoon, up to 4 tabs/day. (Patient not taking: Reported on 07/02/2022) 100 tablet 0    ferrous sulfate 325 (65 FE) MG tablet TAKE 1 TABLET BY MOUTH EVERY OTHER DAY (Patient not taking: Reported on 07/02/2022) 15 tablet 0    insulin glargine (LANTUS SOLOSTAR) 100 UNIT/ML Solostar Pen Inject 5 Units into the skin daily. (Patient not taking: Reported on 07/02/2022) 15 mL 1    Insulin Pen Needle 31G X 5 MM MISC 1 each by Does not apply route at bedtime. 30 each 5    ondansetron (ZOFRAN-ODT) 4 MG disintegrating tablet Take 1 tablet (4 mg total) by mouth every 8 (eight) hours as needed for nausea or vomiting. (Patient not taking: Reported on 09/13/2022) 20 tablet  0    promethazine (PHENERGAN) 12.5 MG tablet Take 1 tablet (12.5 mg total) by mouth every 6 (six) hours as needed for nausea or vomiting. (Patient not taking: Reported on 07/02/2022) 30 tablet 0    I have reviewed patient's Past Medical Hx, Surgical Hx, Family Hx, Social Hx, medications and allergies.   ROS:  Pertinent items noted in HPI and remainder of comprehensive ROS otherwise negative.   Physical Exam  Patient Vitals for the past 24 hrs:  BP Temp Pulse Resp  10/08/22 2117 (!) 162/78 -- 60 --  10/08/22 2100 (!) 158/77 -- 63 --  10/08/22 2050 (!) (P) 166/77 -- 60 --  10/08/22 2032 (!) 156/75 -- (!) 57 --  10/08/22 2017 (!) 157/83 -- 67 --  10/08/22 2005 (!) 186/88 98.3 F (36.8 C) 61 18   Constitutional: Well-developed, well-nourished female in mild distress (headache) Cardiovascular: normal rate & rhythm, warm and well-perfused Respiratory: normal effort, no problems with  respiration noted GI: Abd soft, non-tender, gravid appropriate for gestational age MS: Extremities nontender, no edema, normal ROM Neurologic: Alert and oriented x 4.  GU: no CVA tenderness Pelvic: exam deferred  Fetal Tracing: reactive Baseline: 140 Variability: moderate Accelerations: 15x15 Decelerations: none Toco: UI   Labs: Results for orders placed or performed during the hospital encounter of 10/08/22 (from the past 24 hour(s))  Protein / creatinine ratio, urine     Status: Abnormal   Collection Time: 10/08/22  8:24 PM  Result Value Ref Range   Creatinine, Urine 31 mg/dL   Total Protein, Urine 56 mg/dL   Protein Creatinine Ratio 1.81 (H) 0.00 - 0.15 mg/mg[Cre]    Imaging:  No results found.  MAU Course: Orders Placed This Encounter  Procedures   Protein / creatinine ratio, urine   CBC   Comprehensive metabolic panel   Notify physician (specify) Confirmatory reading of BP> 160/110 15 minutes later   Apply Hypertensive Disorders of Pregnancy Care Plan   Measure blood pressure   Meds ordered this encounter  Medications   acetaminophen-caffeine (EXCEDRIN TENSION HEADACHE) 500-65 MG per tablet 2 tablet   AND Linked Order Group    hydrALAZINE (APRESOLINE) injection 5 mg    hydrALAZINE (APRESOLINE) injection 10 mg    labetalol (NORMODYNE) injection 20 mg    labetalol (NORMODYNE) injection 40 mg   PEC labs entered, ordered Tylenol-Excedrin. Pt then had her second severe range BP, hydralazine protocol ordered and report called to Dr. Landry Mellow as pt now meets criteria for severe PEC. Burman Foster, CNM to assume care and admit pt for labor.  MDM: Moderate  Assessment: 1. Preeclampsia, severe, third trimester    Plan: Care turned over to [facility] to L&D for IOL at 20w6dfor PEC w/ SF  JGaylan Gerold CNM, MSN, IR[place]elawnCertified Nurse Midwife, [place]

## 2022-10-08 NOTE — H&P (Incomplete)
OB ADMISSION/ HISTORY & PHYSICAL:  Admission Date: 10/08/2022  7:56 PM  Admit Diagnosis: ***  Prince Nayyar is a 32 y.o. female [id number] for elevated blood pressure. Pt came to MAU via EMS reporting headache all day with blurry vision and a feeling of fullness in her face/head. Also having high BP - was 180s/100s at home with similar with EMS. Reports RUQ pain, states it "feels like something pushing on my ribs on this side." Endorses active FM, denies LOF and vaginal bleeding. Does not perceive contractions.   History of current pregnancy: [id number]  Patient entered care with [facility] at 13+5 wks.   EDC 11/06/22 by LMP and congruent w/ 14+6 wk U/S.   Anatomy scan:  *** wks, complete w/ *** placenta.   Antenatal testing: for *** started at *** weeks Last evaluation: ***  wks  Significant prenatal events: *** Patient Active Problem List   Diagnosis Date Noted  . History of pre-eclampsia in prior pregnancy, currently pregnant in third trimester 10/08/2022  . Pre-existing type 2 diabetes mellitus in pregnancy 10/08/2022  . Anemia of mother in pregnancy 10/08/2022  . Preeclampsia, severe, third trimester 10/08/2022    Prenatal Labs: ABO, Rh: --/--/A POS (02/09 2110) Antibody: NEG (02/09 2110) Rubella:   *** RPR:   *** HBsAg:   *** HIV:   *** GTT: *** GBS: NEGATIVE/-- (02/09 2149) *** GC/CHL: *** Genetics: *** Vaccines: Tdap: *** Influenza: ***   OB History  Gravida Para Term Preterm AB Living  4 2 1 1 1 2  $ SAB IAB Ectopic Multiple Live Births      1   2    # Outcome Date GA Lbr Len/2nd Weight Sex Delivery Anes PTL Lv  4 Current           3 Preterm 06/27/19    M Vag-Spont   LIV  2 Ectopic 2015          1 Term 02/28/12    MJerilynn MagesVag-Spont   LIV    Medical / Surgical History: Past medical history:  Past Medical History:  Diagnosis Date  . Diabetes mellitus without complication (H[facility])    Type 2  . Pregnancy induced hypertension   . Vaginal Pap smear, abnormal      Past surgical history:  Past Surgical History:  Procedure Laterality Date  . back sx  2005   scoliosis   . HERNIA REPAIR     Family History:  Family History  Problem Relation Age of Onset  . Hypertension Mother   . Diabetes Mother     Social History:  reports that she has never smoked. She has never used smokeless tobacco. She reports that she does not currently use alcohol. She reports that she does not use drugs.  Allergies: Patient has no known allergies.   Current Medications at time of admission:  Prior to Admission medications   Medication Sig Start Date End Date Taking? Authorizing Provider  aspirin 81 MG chewable tablet Chew 81 mg by mouth daily.   Yes [provider]  metFORMIN (GLUCOPHAGE) 1000 MG tablet Take 1,000 mg by mouth 2 (two) times daily with a meal.   Yes [provider]  Prenatal Vit-Fe Fumarate-FA (PRENATAL MULTIVITAMIN) TABS tablet Take 1 tablet by mouth daily at 12 noon.   Yes [provider]  acetaminophen (TYLENOL) 650 MG CR tablet Take 650 mg by mouth every 8 (eight) hours as needed for pain.    [provider]  Doxylamine-Pyridoxine (DICLEGIS)  10-10 MG TBEC Take 2 tabs at bedtime. If needed, add another tab in the morning. If needed, add another tab in the afternoon, up to 4 tabs/day. Patient not taking: Reported on 07/02/2022 03/05/22   Patriciaann Clan, DO  ferrous sulfate 325 (65 FE) MG tablet TAKE 1 TABLET BY MOUTH EVERY OTHER DAY Patient not taking: Reported on 07/02/2022 03/30/22   Donnamae Jude, MD  insulin glargine (LANTUS SOLOSTAR) 100 UNIT/ML Solostar Pen Inject 5 Units into the skin daily. Patient not taking: Reported on 07/02/2022 03/05/22   Charlott Rakes, MD  Insulin Pen Needle 31G X 5 MM MISC 1 each by Does not apply route at bedtime. 03/05/22   Charlott Rakes, MD  ondansetron (ZOFRAN-ODT) 4 MG disintegrating tablet Take 1 tablet (4 mg total) by mouth every 8 (eight) hours as needed for nausea or  vomiting. Patient not taking: Reported on 09/13/2022 08/20/22   Tresea Mall, CNM  promethazine (PHENERGAN) 12.5 MG tablet Take 1 tablet (12.5 mg total) by mouth every 6 (six) hours as needed for nausea or vomiting. Patient not taking: Reported on 07/02/2022 03/28/22   Nugent, Gerrie Nordmann, NP    Review of Systems: Constitutional: Negative   HENT: Negative   Eyes: Negative   Respiratory: Negative   Cardiovascular: Negative   Gastrointestinal: Negative  Genitourinary: *** for bloody show, *** for LOF   Musculoskeletal: Negative   Skin: Negative   Neurological: Negative   Endo/Heme/Allergies: Negative   Psychiatric/Behavioral: Negative    Physical Exam: VS: Blood pressure (!) 148/86, pulse 69, temperature 98.3 F (36.8 C), resp. rate 18, height 5' 4"$  (1.626 m), weight 86.2 kg, last menstrual period 01/30/2022. AAO x3, no signs of distress Cardiovascular: RRR Respiratory: Unlabored GU/GI: Abdomen gravid, non-tender, non-distended, active FM, vertex, EFW *** per Leopold's Extremities: *** edema, negative for pain, tenderness, and cords  Cervical exam:  FHR: baseline rate *** / variability *** / accelerations *** / *** decelerations TOCO: ***   Prenatal Transfer Tool  Maternal Diabetes: {Maternal Diabetes:3043596} Genetic Screening: {Genetic Screening:20205} Maternal Ultrasounds/Referrals: {Maternal Ultrasounds / Referrals:20211} Fetal Ultrasounds or other Referrals:  {Fetal Ultrasounds or Other Referrals:20213} Maternal Substance Abuse:  {Maternal Substance Abuse:20223} Significant Maternal Medications:  {Significant Maternal Meds:20233} Significant Maternal Lab Results: {Significant Maternal Lab Results:20235} Number of Prenatal Visits:{Prenatal Visits:27860} Other Comments:  {Other Comments:20251}    Assessment: 32 y.o. [id number] [redacted]w[redacted]d *** stage of labor FHR category *** GBS *** Pain management plan: ***   Plan:  Admit to L&D Routine admission orders Epidural  PRN *** Dr *Marland Kitchennotified of admission and plan of care  VArrie EasternDNP, CNM 10/08/2022 11:14 PM

## 2022-10-08 NOTE — MAU Note (Signed)
.  Marilyn Hughes is a 32 y.o. at 51w6dhere in MAU reporting: EMS arrival. Pt has had headache most of the  day has not taken any pain meds for it.  B/P has been elevated 180/100 at home and similar by EMS. B/P has been elevated this week. Has had pre -e with previous pregnancies. Reports cloudy/ blurry vision and pain in Right upper quadrant. Good fetal movement felt. Deneis any vag bleing or leaking . Reports some increased pelvic pain and pressure as well.  LMP:  Onset of complaint: today  Pain score: 6 Vitals:   10/08/22 2017  BP: (!) 157/83  Pulse: 67     FHT:130 Lab orders placed from triage:  Pre-E labs

## 2022-10-09 ENCOUNTER — Inpatient Hospital Stay (HOSPITAL_COMMUNITY): Payer: Medicaid Other | Admitting: Anesthesiology

## 2022-10-09 ENCOUNTER — Encounter (HOSPITAL_COMMUNITY): Payer: Self-pay | Admitting: Obstetrics and Gynecology

## 2022-10-09 LAB — COMPREHENSIVE METABOLIC PANEL
ALT: 9 U/L (ref 0–44)
AST: 18 U/L (ref 15–41)
Albumin: 2.3 g/dL — ABNORMAL LOW (ref 3.5–5.0)
Alkaline Phosphatase: 122 U/L (ref 38–126)
Anion gap: 10 (ref 5–15)
BUN: <5 mg/dL — ABNORMAL LOW (ref 6–20)
CO2: 19 mmol/L — ABNORMAL LOW (ref 22–32)
Calcium: 6.9 mg/dL — ABNORMAL LOW (ref 8.9–10.3)
Chloride: 106 mmol/L (ref 98–111)
Creatinine, Ser: 0.62 mg/dL (ref 0.44–1.00)
GFR, Estimated: >60 mL/min (ref >60)
Glucose, Bld: 128 mg/dL — ABNORMAL HIGH (ref 70–99)
Potassium: 3 mmol/L — ABNORMAL LOW (ref 3.5–5.1)
Sodium: 135 mmol/L (ref 135–145)
Total Bilirubin: 0.1 mg/dL — ABNORMAL LOW (ref 0.3–1.2)
Total Protein: 6.2 g/dL — ABNORMAL LOW (ref 6.5–8.1)

## 2022-10-09 LAB — CBC
HCT: 26.2 % — ABNORMAL LOW (ref 36.0–46.0)
HCT: 27.5 % — ABNORMAL LOW (ref 36.0–46.0)
Hemoglobin: 8.3 g/dL — ABNORMAL LOW (ref 12.0–15.0)
Hemoglobin: 8.3 g/dL — ABNORMAL LOW (ref 12.0–15.0)
MCH: 23.8 pg — ABNORMAL LOW (ref 26.0–34.0)
MCH: 23.2 pg — ABNORMAL LOW (ref 26.0–34.0)
MCHC: 31.7 g/dL (ref 30.0–36.0)
MCHC: 30.2 g/dL (ref 30.0–36.0)
MCV: 75.1 fL — ABNORMAL LOW (ref 80.0–100.0)
MCV: 77 fL — ABNORMAL LOW (ref 80.0–100.0)
Platelets: 181 10*3/uL (ref 150–400)
Platelets: 181 10*3/uL (ref 150–400)
RBC: 3.49 MIL/uL — ABNORMAL LOW (ref 3.87–5.11)
RBC: 3.57 MIL/uL — ABNORMAL LOW (ref 3.87–5.11)
RDW: 16.1 % — ABNORMAL HIGH (ref 11.5–15.5)
RDW: 16.2 % — ABNORMAL HIGH (ref 11.5–15.5)
WBC: 8.8 10*3/uL (ref 4.0–10.5)
WBC: 7.2 10*3/uL (ref 4.0–10.5)
nRBC: 0.2 % (ref 0.0–0.2)
nRBC: 0 % (ref 0.0–0.2)

## 2022-10-09 LAB — GLUCOSE, CAPILLARY
Glucose-Capillary: 103 mg/dL — ABNORMAL HIGH (ref 70–99)
Glucose-Capillary: 86 mg/dL (ref 70–99)
Glucose-Capillary: 105 mg/dL — ABNORMAL HIGH (ref 70–99)
Glucose-Capillary: 124 mg/dL — ABNORMAL HIGH (ref 70–99)
Glucose-Capillary: 83 mg/dL (ref 70–99)

## 2022-10-09 LAB — OB RESULTS CONSOLE RUBELLA ANTIBODY, IGM: Rubella: IMMUNE

## 2022-10-09 LAB — HEMOGLOBIN A1C
Hgb A1c MFr Bld: 6.6 % — ABNORMAL HIGH (ref 4.8–5.6)
Mean Plasma Glucose: 142.72 mg/dL

## 2022-10-09 LAB — OB RESULTS CONSOLE HEPATITIS B SURFACE ANTIGEN: Hepatitis B Surface Ag: NEGATIVE

## 2022-10-09 LAB — RPR: RPR Ser Ql: NONREACTIVE

## 2022-10-09 LAB — MAGNESIUM: Magnesium: 5.2 mg/dL — ABNORMAL HIGH (ref 1.7–2.4)

## 2022-10-09 LAB — OB RESULTS CONSOLE HIV ANTIBODY (ROUTINE TESTING): HIV: NONREACTIVE

## 2022-10-09 MED ORDER — MISOPROSTOL 25 MCG QUARTER TABLET
25.0000 ug | ORAL_TABLET | Freq: Once | ORAL | Status: AC
  Administered 2022-10-09: 25 ug via VAGINAL
  Filled 2022-10-09: qty 1

## 2022-10-09 MED ORDER — EPHEDRINE 5 MG/ML INJ: 10.0000 mg | INTRAVENOUS | Status: DC | PRN

## 2022-10-09 MED ORDER — PHENYLEPHRINE 80 MCG/ML (10ML) SYRINGE FOR IV PUSH (FOR BLOOD PRESSURE SUPPORT): 80.0000 ug | PREFILLED_SYRINGE | INTRAVENOUS | Status: DC | PRN

## 2022-10-09 MED ORDER — TRANEXAMIC ACID-NACL 1000-0.7 MG/100ML-% IV SOLN
1000.0000 mg | Freq: Once | INTRAVENOUS | Status: AC
  Administered 2022-10-09: 1000 mg via INTRAVENOUS
  Filled 2022-10-09: qty 100

## 2022-10-09 MED ORDER — LIDOCAINE HCL (PF) 1 % IJ SOLN
INTRAMUSCULAR | Status: DC | PRN
  Administered 2022-10-09 (×2): 5 mL via EPIDURAL

## 2022-10-09 MED ORDER — TERBUTALINE SULFATE 1 MG/ML IJ SOLN: 0.2500 mg | Freq: Once | INTRAMUSCULAR | Status: DC | PRN

## 2022-10-09 MED ORDER — LACTATED RINGERS IV SOLN
500.0000 mL | Freq: Once | INTRAVENOUS | Status: AC
  Administered 2022-10-09: 500 mL via INTRAVENOUS

## 2022-10-09 MED ORDER — DIPHENHYDRAMINE HCL 50 MG/ML IJ SOLN: 12.5000 mg | INTRAMUSCULAR | Status: DC | PRN

## 2022-10-09 MED ORDER — FENTANYL-BUPIVACAINE-NACL 0.5-0.125-0.9 MG/250ML-% EP SOLN
12.0000 mL/h | EPIDURAL | Status: DC | PRN
  Administered 2022-10-09: 12 mL/h via EPIDURAL
  Filled 2022-10-09: qty 250

## 2022-10-09 MED ORDER — MISOPROSTOL 50MCG HALF TABLET
50.0000 ug | ORAL_TABLET | Freq: Once | ORAL | Status: AC
  Administered 2022-10-09: 50 ug via ORAL
  Filled 2022-10-09: qty 1

## 2022-10-09 MED ORDER — OXYTOCIN-SODIUM CHLORIDE 30-0.9 UT/500ML-% IV SOLN
1.0000 m[IU]/min | INTRAVENOUS | Status: DC
  Administered 2022-10-09: 2 m[IU]/min via INTRAVENOUS
  Filled 2022-10-09 (×2): qty 500

## 2022-10-09 NOTE — Anesthesia Procedure Notes (Signed)
Epidural Patient location during procedure: OB Start time: 10/09/2022 3:16 PM End time: 10/09/2022 3:26 PM  Staffing Anesthesiologist: Josephine Igo, MD Performed: anesthesiologist   Preanesthetic Checklist Completed: patient identified, IV checked, site marked, risks and benefits discussed, surgical consent, monitors and equipment checked, pre-op evaluation and timeout performed  Epidural Patient position: sitting Prep: DuraPrep and site prepped and draped Patient monitoring: continuous pulse ox and blood pressure Approach: midline Location: L3-L4 Injection technique: LOR air  Needle:  Needle type: Tuohy  Needle gauge: 17 G Needle length: 9 cm and 9 Needle insertion depth: 6 cm Catheter type: closed end flexible Catheter size: 19 Gauge Catheter at skin depth: 11 cm Test dose: negative and Other  Assessment Events: blood not aspirated, no cerebrospinal fluid, injection not painful, no injection resistance, no paresthesia and negative IV test  Additional Notes Patient identified. Risks and benefits discussed including failed block, incomplete  Pain control, post dural puncture headache, nerve damage, paralysis, blood pressure Changes, nausea, vomiting, reactions to medications-both toxic and allergic and post Partum back pain. All questions were answered. Patient expressed understanding and wished to proceed. Sterile technique was used throughout procedure. Epidural site was Dressed with sterile barrier dressing. No paresthesias, signs of intravascular injection Or signs of intrathecal spread were encountered.  Patient was more comfortable after the epidural was dosed. Please see RN's note for documentation of vital signs and FHR which are stable. Reason for block:procedure for pain

## 2022-10-09 NOTE — Anesthesia Preprocedure Evaluation (Signed)
Anesthesia Evaluation  Patient identified by MRN, date of birth, ID band Patient awake    Reviewed: Allergy & Precautions, Patient's Chart, lab work & pertinent test results  Airway Mallampati: I       Dental no notable dental hx. (+) Teeth Intact   Pulmonary neg pulmonary ROS   Pulmonary exam normal breath sounds clear to auscultation       Cardiovascular hypertension, Pt. on medications Normal cardiovascular exam Rhythm:Regular Rate:Normal     Neuro/Psych negative neurological ROS  negative psych ROS   GI/Hepatic Neg liver ROS,GERD  ,,  Endo/Other  diabetes, Poorly Controlled, Gestational, Insulin Dependent, Oral Hypoglycemic Agents  Obesity  Renal/GU negative Renal ROS  negative genitourinary   Musculoskeletal Scoliosis S/P Harrington Rods   Abdominal  (+) + obese  Peds  Hematology  (+) Blood dyscrasia, anemia   Anesthesia Other Findings   Reproductive/Obstetrics (+) Pregnancy                             Anesthesia Physical Anesthesia Plan  ASA: 2  Anesthesia Plan:    Post-op Pain Management: Minimal or no pain anticipated   Induction:   PONV Risk Score and Plan:   Airway Management Planned: Natural Airway  Additional Equipment:   Intra-op Plan:   Post-operative Plan:   Informed Consent: I have reviewed the patients History and Physical, chart, labs and discussed the procedure including the risks, benefits and alternatives for the proposed anesthesia with the patient or authorized representative who has indicated his/her understanding and acceptance.       Plan Discussed with: Anesthesiologist  Anesthesia Plan Comments:        Anesthesia Quick Evaluation

## 2022-10-09 NOTE — Progress Notes (Signed)
Subjective:    Report resolution of HA and visual changes and RUQ pain now "feels like mild pressure". Contraction frequency has increased and pt perceives mild-moderate pain with contractions.   Objective:    VS: BP (!) 146/88   Pulse 75   Temp 97.8 F (36.6 C) (Oral)   Resp 16   Ht 5' 4"$  (1.626 m)   Wt 86.2 kg   LMP 01/30/2022   SpO2 99%   BMI 32.61 kg/m  FHR : baseline 135 / variability moderate / accelerations present / absent decelerations Toco: contractions every 2-4 minutes  Membranes: intact Dilation: 2 Effacement (%): 50 Cervical Position: Posterior Station: -3 Presentation: Vertex Exam by:: Clois Dupes CNM   Assessment/Plan:   32 y.o. [id number] [redacted]w[redacted]d Pre-eclampsia with severe features    -severe range BP    -HA and visual disturbances resolved, and epigastric pain has minimized    -protein creatinine ratio 1.81 Pre-pregnancy insulin dependent diabetes    -clear liquid diet    -CBG Q 4 hrs    -initiate endotool for 2 consecutive CBG values 120 or greater   Labor:  S/P  Misoprostol 50 mcg buccal 25 mcg vaginal, starting Pitocin Preeclampsia:  on magnesium sulfate and no signs or symptoms of toxicity Fetal Wellbeing:  Category I Pain Control:   planning for an epidural I/D:   GBS neg Anticipated MOD:  NSVD  VArrie EasternDNP, CNM 10/09/2022 7:50 AM

## 2022-10-09 NOTE — Plan of Care (Signed)
  Problem: Education: Goal: Knowledge of disease or condition will improve Outcome: Progressing Goal: Knowledge of the prescribed therapeutic regimen will improve Outcome: Progressing   Problem: Fluid Volume: Goal: Peripheral tissue perfusion will improve Outcome: Progressing   Problem: Clinical Measurements: Goal: Complications related to disease process, condition or treatment will be avoided or minimized Outcome: Progressing   Problem: Education: Goal: Knowledge of General Education information will improve Description: Including pain rating scale, medication(s)/side effects and non-pharmacologic comfort measures Outcome: Progressing   Problem: Health Behavior/Discharge Planning: Goal: Ability to manage health-related needs will improve Outcome: Progressing   Problem: Clinical Measurements: Goal: Ability to maintain clinical measurements within normal limits will improve Outcome: Progressing Goal: Will remain free from infection Outcome: Progressing Goal: Diagnostic test results will improve Outcome: Progressing Goal: Respiratory complications will improve Outcome: Progressing Goal: Cardiovascular complication will be avoided Outcome: Progressing   Problem: Activity: Goal: Risk for activity intolerance will decrease Outcome: Progressing   Problem: Nutrition: Goal: Adequate nutrition will be maintained Outcome: Progressing   Problem: Coping: Goal: Level of anxiety will decrease Outcome: Progressing   Problem: Elimination: Goal: Will not experience complications related to bowel motility Outcome: Progressing Goal: Will not experience complications related to urinary retention Outcome: Progressing   Problem: Pain Managment: Goal: General experience of comfort will improve Outcome: Progressing   Problem: Safety: Goal: Ability to remain free from injury will improve Outcome: Progressing   Problem: Skin Integrity: Goal: Risk for impaired skin integrity will  decrease Outcome: Progressing   Problem: Education: Goal: Ability to describe self-care measures that may prevent or decrease complications (Diabetes Survival Skills Education) will improve Outcome: Progressing Goal: Individualized Educational Video(s) Outcome: Progressing   Problem: Coping: Goal: Ability to adjust to condition or change in health will improve Outcome: Progressing   Problem: Fluid Volume: Goal: Ability to maintain a balanced intake and output will improve Outcome: Progressing   Problem: Health Behavior/Discharge Planning: Goal: Ability to identify and utilize available resources and services will improve Outcome: Progressing Goal: Ability to manage health-related needs will improve Outcome: Progressing   Problem: Metabolic: Goal: Ability to maintain appropriate glucose levels will improve Outcome: Progressing   Problem: Nutritional: Goal: Maintenance of adequate nutrition will improve Outcome: Progressing Goal: Progress toward achieving an optimal weight will improve Outcome: Progressing   Problem: Skin Integrity: Goal: Risk for impaired skin integrity will decrease Outcome: Progressing   Problem: Tissue Perfusion: Goal: Adequacy of tissue perfusion will improve Outcome: Progressing   Problem: Education: Goal: Knowledge of Childbirth will improve Outcome: Progressing Goal: Ability to make informed decisions regarding treatment and plan of care will improve Outcome: Progressing Goal: Ability to state and carry out methods to decrease the pain will improve Outcome: Progressing Goal: Individualized Educational Video(s) Outcome: Progressing   Problem: Coping: Goal: Ability to verbalize concerns and feelings about labor and delivery will improve Outcome: Progressing   Problem: Life Cycle: Goal: Ability to make normal progression through stages of labor will improve Outcome: Progressing Goal: Ability to effectively push during vaginal delivery will  improve Outcome: Progressing   Problem: Role Relationship: Goal: Will demonstrate positive interactions with the child Outcome: Progressing   Problem: Safety: Goal: Risk of complications during labor and delivery will decrease Outcome: Progressing   Problem: Pain Management: Goal: Relief or control of pain from uterine contractions will improve Outcome: Progressing

## 2022-10-09 NOTE — Progress Notes (Signed)
  Subjective: In to introduce myself to Marilyn Hughes. She reports contractions rated as 7  out of 10. +FM. No lof no vagina bleeding. She denies headache visual disturbances or ruq pain.   Objective: BP 121/85   Pulse 74   Temp 98.2 F (36.8 C) (Oral)   Resp 18   Ht '5\' 4"'$  (1.626 m)   Wt 86.2 kg   LMP 01/30/2022   SpO2 99%   BMI 32.61 kg/m  I/O last 3 completed shifts: In: -  Out: 1350 [XOVAN:1916] Total I/O In: 660.1 [I.V.:660.1] Out: 500 [Urine:500]  FHT:  FHR: 125 bpm, variability: moderate,  accelerations:  Present,  decelerations:  Absent UC:   regular, every 1-3 minutes SVE:   Dilation: 2 Effacement (%): 50 Station: -3 Exam by:: Clois Dupes CNM  Labs: Lab Results  Component Value Date   WBC 7.2 10/08/2022   HGB 7.8 (L) 10/08/2022   HCT 24.3 (L) 10/08/2022   MCV 76.4 (L) 10/08/2022   PLT 180 10/08/2022    Assessment / Plan: Induction of labor due to preeclampsia with severe features.   Labor: Progressing on Pitocin, will continue to increase then AROM Preeclampsia:  labs stable Fetal Wellbeing:  Category I Pain Control:  Labor support without medications I/D:  n/a Anticipated MOD:  NSVD  Marilyn Louis, MD 10/09/2022, 10:12 AM

## 2022-10-09 NOTE — Progress Notes (Signed)
Inpatient Diabetes Program Recommendations  AACE/ADA: New Consensus Statement on Inpatient Glycemic Control (2015)  Target Ranges:  Prepandial:   less than 140 mg/dL      Peak postprandial:   less than 180 mg/dL (1-2 hours)      Critically ill patients:  140 - 180 mg/dL   Lab Results  Component Value Date   GLUCAP 105 (H) 10/09/2022   HGBA1C 6.6 (H) 10/08/2022    Review of Glycemic Control  Latest Reference Range & Units 10/08/22 23:24 10/09/22 03:55 10/09/22 08:25 10/09/22 12:36  Glucose-Capillary 70 - 99 mg/dL 90 103 (H) 86 105 (H)   Diabetes history: DM 2 Outpatient Diabetes medications:  Metformin 1000 mg bid Lantus 5 units daily (not taking) Current orders for Inpatient glycemic control:  IV insulin if >120 mg/dL x2  Inpatient Diabetes Program Recommendations:    Blood sugars controlled in labor.  No recs.  Can resume Metformin after delivery.   Thanks,  Adah Perl, RN, BC-ADM Inpatient Diabetes Coordinator Pager [phone]  (8a-5p)

## 2022-10-09 NOTE — Progress Notes (Signed)
   Subjective: Patient is comfortable with her epidural. She denies headache visual disturbances or ruq pain.   Objective: BP (!) 153/86   Pulse 77   Temp 98.2 F (36.8 C) (Oral)   Resp 16   Ht '5\' 4"'$  (1.626 m)   Wt 86.2 kg   LMP 01/30/2022   SpO2 99%   BMI 32.61 kg/m  I/O last 3 completed shifts: In: -  Out: 1350 [QMKJI:3128] Total I/O In: 660.1 [I.V.:660.1] Out: 500 [Urine:500]  FHT:  FHR: 120 bpm, variability: moderate,  accelerations:  Present,  decelerations:  Absent UC:   irregular, every 1-6 minutes SVE:   2.5/50/-3 AROM clear fluid IUPC placed without difficulty   Labs: Lab Results  Component Value Date   WBC 8.8 10/09/2022   HGB 8.3 (L) 10/09/2022   HCT 26.2 (L) 10/09/2022   MCV 75.1 (L) 10/09/2022   PLT 181 10/09/2022    Assessment / Plan: Induction of labor due to severe preeclampsia   Labor:  AROM performed and IUPC placed increase pitocin as tolerated to reach MVU's 200 Preeclampsia:  labs stable Fetal Wellbeing:  Category I Pain Control:  Epidural I/D:  n/a Anticipated MOD:  NSVD  Christophe Louis, MD 10/09/2022, 5:16 PM

## 2022-10-10 LAB — COMPREHENSIVE METABOLIC PANEL
ALT: 9 U/L (ref 0–44)
AST: 23 U/L (ref 15–41)
Albumin: 2.2 g/dL — ABNORMAL LOW (ref 3.5–5.0)
Alkaline Phosphatase: 132 U/L — ABNORMAL HIGH (ref 38–126)
Anion gap: 11 (ref 5–15)
BUN: <5 mg/dL — ABNORMAL LOW (ref 6–20)
CO2: 19 mmol/L — ABNORMAL LOW (ref 22–32)
Calcium: 6.4 mg/dL — CL (ref 8.9–10.3)
Chloride: 103 mmol/L (ref 98–111)
Creatinine, Ser: 0.61 mg/dL (ref 0.44–1.00)
GFR, Estimated: >60 mL/min (ref >60)
Glucose, Bld: 206 mg/dL — ABNORMAL HIGH (ref 70–99)
Potassium: 3 mmol/L — ABNORMAL LOW (ref 3.5–5.1)
Sodium: 133 mmol/L — ABNORMAL LOW (ref 135–145)
Total Bilirubin: 0.3 mg/dL (ref 0.3–1.2)
Total Protein: 5.8 g/dL — ABNORMAL LOW (ref 6.5–8.1)

## 2022-10-10 LAB — CBC
HCT: 27.2 % — ABNORMAL LOW (ref 36.0–46.0)
HCT: 28.7 % — ABNORMAL LOW (ref 36.0–46.0)
Hemoglobin: 8.6 g/dL — ABNORMAL LOW (ref 12.0–15.0)
Hemoglobin: 8.9 g/dL — ABNORMAL LOW (ref 12.0–15.0)
MCH: 23.5 pg — ABNORMAL LOW (ref 26.0–34.0)
MCH: 23.8 pg — ABNORMAL LOW (ref 26.0–34.0)
MCHC: 31 g/dL (ref 30.0–36.0)
MCHC: 31.6 g/dL (ref 30.0–36.0)
MCV: 75.3 fL — ABNORMAL LOW (ref 80.0–100.0)
MCV: 75.9 fL — ABNORMAL LOW (ref 80.0–100.0)
Platelets: 179 10*3/uL (ref 150–400)
Platelets: 181 10*3/uL (ref 150–400)
RBC: 3.61 MIL/uL — ABNORMAL LOW (ref 3.87–5.11)
RBC: 3.78 MIL/uL — ABNORMAL LOW (ref 3.87–5.11)
RDW: 16.2 % — ABNORMAL HIGH (ref 11.5–15.5)
RDW: 16.3 % — ABNORMAL HIGH (ref 11.5–15.5)
WBC: 10.1 10*3/uL (ref 4.0–10.5)
WBC: 10.9 10*3/uL — ABNORMAL HIGH (ref 4.0–10.5)
nRBC: 0 % (ref 0.0–0.2)
nRBC: 0 % (ref 0.0–0.2)

## 2022-10-10 LAB — GLUCOSE, CAPILLARY
Glucose-Capillary: 117 mg/dL — ABNORMAL HIGH (ref 70–99)
Glucose-Capillary: 124 mg/dL — ABNORMAL HIGH (ref 70–99)
Glucose-Capillary: 205 mg/dL — ABNORMAL HIGH (ref 70–99)
Glucose-Capillary: 79 mg/dL (ref 70–99)
Glucose-Capillary: 88 mg/dL (ref 70–99)
Glucose-Capillary: 89 mg/dL (ref 70–99)

## 2022-10-10 LAB — MAGNESIUM: Magnesium: 5.5 mg/dL — ABNORMAL HIGH (ref 1.7–2.4)

## 2022-10-10 MED ORDER — IBUPROFEN 600 MG PO TABS
600.0000 mg | ORAL_TABLET | Freq: Four times a day (QID) | ORAL | Status: DC
  Administered 2022-10-10 – 2022-10-12 (×11): 600 mg via ORAL
  Filled 2022-10-10 (×11): qty 1

## 2022-10-10 MED ORDER — COCONUT OIL OIL: 1.0000 | TOPICAL_OIL | Status: DC | PRN

## 2022-10-10 MED ORDER — INSULIN ASPART 100 UNIT/ML IJ SOLN
0.0000 [IU] | Freq: Three times a day (TID) | INTRAMUSCULAR | Status: DC
  Administered 2022-10-10 – 2022-10-12 (×2): 2 [IU] via SUBCUTANEOUS

## 2022-10-10 MED ORDER — FERROUS SULFATE 325 (65 FE) MG PO TABS
325.0000 mg | ORAL_TABLET | Freq: Two times a day (BID) | ORAL | Status: DC
  Administered 2022-10-10 – 2022-10-12 (×5): 325 mg via ORAL
  Filled 2022-10-10 (×5): qty 1

## 2022-10-10 MED ORDER — ONDANSETRON HCL 4 MG PO TABS: 4.0000 mg | ORAL_TABLET | ORAL | Status: DC | PRN

## 2022-10-10 MED ORDER — ZOLPIDEM TARTRATE 5 MG PO TABS: 5.0000 mg | ORAL_TABLET | Freq: Every evening | ORAL | Status: DC | PRN

## 2022-10-10 MED ORDER — ONDANSETRON HCL 4 MG/2ML IJ SOLN: 4.0000 mg | INTRAMUSCULAR | Status: DC | PRN

## 2022-10-10 MED ORDER — ACETAMINOPHEN 325 MG PO TABS: 650.0000 mg | ORAL_TABLET | ORAL | Status: DC | PRN

## 2022-10-10 MED ORDER — CALCIUM CARBONATE 1250 (500 CA) MG PO TABS
1.0000 | ORAL_TABLET | Freq: Two times a day (BID) | ORAL | Status: AC
  Administered 2022-10-10 – 2022-10-11 (×4): 1250 mg via ORAL
  Filled 2022-10-10 (×4): qty 1

## 2022-10-10 MED ORDER — SENNOSIDES-DOCUSATE SODIUM 8.6-50 MG PO TABS
2.0000 | ORAL_TABLET | Freq: Every day | ORAL | Status: DC
  Administered 2022-10-10 – 2022-10-12 (×3): 2 via ORAL
  Filled 2022-10-10 (×3): qty 2

## 2022-10-10 MED ORDER — BENZOCAINE-MENTHOL 20-0.5 % EX AERO
1.0000 | INHALATION_SPRAY | CUTANEOUS | Status: DC | PRN
  Administered 2022-10-10: 1 via TOPICAL
  Filled 2022-10-10: qty 56

## 2022-10-10 MED ORDER — SIMETHICONE 80 MG PO CHEW: 80.0000 mg | CHEWABLE_TABLET | ORAL | Status: DC | PRN

## 2022-10-10 MED ORDER — INSULIN ASPART 100 UNIT/ML IJ SOLN
5.0000 [IU] | Freq: Once | INTRAMUSCULAR | Status: AC
  Administered 2022-10-10: 5 [IU] via SUBCUTANEOUS

## 2022-10-10 MED ORDER — DIBUCAINE (PERIANAL) 1 % EX OINT: 1.0000 | TOPICAL_OINTMENT | CUTANEOUS | Status: DC | PRN

## 2022-10-10 MED ORDER — POTASSIUM CHLORIDE CRYS ER 20 MEQ PO TBCR
20.0000 meq | EXTENDED_RELEASE_TABLET | Freq: Two times a day (BID) | ORAL | Status: DC
  Administered 2022-10-10 – 2022-10-12 (×5): 20 meq via ORAL
  Filled 2022-10-10 (×5): qty 1

## 2022-10-10 MED ORDER — NIFEDIPINE ER OSMOTIC RELEASE 30 MG PO TB24
30.0000 mg | ORAL_TABLET | Freq: Every day | ORAL | Status: DC
  Administered 2022-10-10: 30 mg via ORAL
  Filled 2022-10-10: qty 1

## 2022-10-10 MED ORDER — PRENATAL MULTIVITAMIN CH
1.0000 | ORAL_TABLET | Freq: Every day | ORAL | Status: DC
  Administered 2022-10-10 – 2022-10-12 (×3): 1 via ORAL
  Filled 2022-10-10 (×3): qty 1

## 2022-10-10 MED ORDER — WITCH HAZEL-GLYCERIN EX PADS: 1.0000 | MEDICATED_PAD | CUTANEOUS | Status: DC | PRN

## 2022-10-10 MED ORDER — DIPHENHYDRAMINE HCL 25 MG PO CAPS: 25.0000 mg | ORAL_CAPSULE | Freq: Four times a day (QID) | ORAL | Status: DC | PRN

## 2022-10-10 MED ORDER — METFORMIN HCL 500 MG PO TABS
1000.0000 mg | ORAL_TABLET | Freq: Two times a day (BID) | ORAL | Status: DC
  Administered 2022-10-10 – 2022-10-12 (×5): 1000 mg via ORAL
  Filled 2022-10-10 (×5): qty 2

## 2022-10-10 NOTE — Progress Notes (Signed)
Post Partum Day 1 Subjective: up ad lib, voiding, and tolerating PO reports difficulty focusing on the magnesium   Objective: Blood pressure (!) 141/82, pulse 77, temperature 97.9 F (36.6 C), temperature source Oral, resp. rate 16, height 5' 4"$  (1.626 m), weight 86.2 kg, last menstrual period 01/30/2022, SpO2 98 %, unknown if currently breastfeeding.  Physical Exam:  General: alert, cooperative, and no distress Lochia: appropriate Uterine Fundus: firm Incision: NA DVT Evaluation: No evidence of DVT seen on physical exam.  Recent Labs    10/10/22 0011 10/10/22 0340  HGB 8.9* 8.6*  HCT 28.7* 27.2*    Assessment/Plan: Lactation consult Preeclampsia with severe features - continue magnesium for 24 hours postdelivery. To end at 10 pm this evening.  Elevated bp - started on procardia xl 30 mg daily Type 2 DM- metformin 1000 mg bid .Marland Kitchen Coverage with SSI Hypocalcium replaced with oscal bid . Check cmp and cbc in am  Hypokalemia- replaced with kdur 20 meq bid  Possible d/c home tomorrow    LOS: 2 days   Christophe Louis, MD 10/10/2022, 12:46 PM

## 2022-10-10 NOTE — Plan of Care (Signed)
  Problem: Education: Goal: Knowledge of disease or condition will improve Outcome: Progressing Goal: Knowledge of the prescribed therapeutic regimen will improve Outcome: Progressing   Problem: Fluid Volume: Goal: Peripheral tissue perfusion will improve Outcome: Progressing   Problem: Clinical Measurements: Goal: Complications related to disease process, condition or treatment will be avoided or minimized Outcome: Progressing   Problem: Education: Goal: Knowledge of General Education information will improve Description: Including pain rating scale, medication(s)/side effects and non-pharmacologic comfort measures Outcome: Progressing   Problem: Health Behavior/Discharge Planning: Goal: Ability to manage health-related needs will improve Outcome: Progressing   Problem: Clinical Measurements: Goal: Ability to maintain clinical measurements within normal limits will improve Outcome: Progressing Goal: Will remain free from infection Outcome: Progressing Goal: Diagnostic test results will improve Outcome: Progressing Goal: Respiratory complications will improve Outcome: Progressing Goal: Cardiovascular complication will be avoided Outcome: Progressing   Problem: Activity: Goal: Risk for activity intolerance will decrease Outcome: Progressing   Problem: Nutrition: Goal: Adequate nutrition will be maintained Outcome: Progressing   Problem: Coping: Goal: Level of anxiety will decrease Outcome: Progressing   Problem: Elimination: Goal: Will not experience complications related to bowel motility Outcome: Progressing Goal: Will not experience complications related to urinary retention Outcome: Progressing   Problem: Pain Managment: Goal: General experience of comfort will improve Outcome: Progressing   Problem: Safety: Goal: Ability to remain free from injury will improve Outcome: Progressing   Problem: Skin Integrity: Goal: Risk for impaired skin integrity will  decrease Outcome: Progressing   Problem: Education: Goal: Ability to describe self-care measures that may prevent or decrease complications (Diabetes Survival Skills Education) will improve Outcome: Progressing Goal: Individualized Educational Video(s) Outcome: Progressing   Problem: Coping: Goal: Ability to adjust to condition or change in health will improve Outcome: Progressing   Problem: Fluid Volume: Goal: Ability to maintain a balanced intake and output will improve Outcome: Progressing   Problem: Health Behavior/Discharge Planning: Goal: Ability to identify and utilize available resources and services will improve Outcome: Progressing Goal: Ability to manage health-related needs will improve Outcome: Progressing   Problem: Metabolic: Goal: Ability to maintain appropriate glucose levels will improve Outcome: Progressing   Problem: Nutritional: Goal: Maintenance of adequate nutrition will improve Outcome: Progressing Goal: Progress toward achieving an optimal weight will improve Outcome: Progressing   Problem: Skin Integrity: Goal: Risk for impaired skin integrity will decrease Outcome: Progressing   Problem: Tissue Perfusion: Goal: Adequacy of tissue perfusion will improve Outcome: Progressing   Problem: Education: Goal: Knowledge of condition will improve Outcome: Progressing Goal: Individualized Educational Video(s) Outcome: Progressing Goal: Individualized Newborn Educational Video(s) Outcome: Progressing   Problem: Activity: Goal: Will verbalize the importance of balancing activity with adequate rest periods Outcome: Progressing Goal: Ability to tolerate increased activity will improve Outcome: Progressing   Problem: Coping: Goal: Ability to identify and utilize available resources and services will improve Outcome: Progressing   Problem: Life Cycle: Goal: Chance of risk for complications during the postpartum period will decrease Outcome:  Progressing   Problem: Role Relationship: Goal: Ability to demonstrate positive interaction with newborn will improve Outcome: Progressing   Problem: Skin Integrity: Goal: Demonstration of wound healing without infection will improve Outcome: Progressing

## 2022-10-10 NOTE — Lactation Note (Signed)
This note was copied from a baby's chart. Lactation Consultation Note  Patient Name: Marilyn Hughes [id number] Date: 10/10/2022   Age:34 hours RN requested to see if [place] could possible get baby to take some formula since baby isn't feeding. RN concerned since mom GDM. Baby isn't jittery. [place] attempted. Baby very sleepy. Last glucose 44 at 0444. Suggested to RN obtain glucose, may be low.  Maternal Data    Feeding    LATCH Score                    Lactation Tools Discussed/Used    Interventions    Discharge    Consult Status      Theodoro Kalata 10/10/2022, 9:42 PM

## 2022-10-10 NOTE — Anesthesia Postprocedure Evaluation (Signed)
Anesthesia Post Note  Patient: Marilyn Hughes  Procedure(s) Performed: AN AD HOC LABOR EPIDURAL     Patient location during evaluation: Mother Baby Anesthesia Type: Epidural Level of consciousness: awake and alert Pain management: pain level controlled Vital Signs Assessment: post-procedure vital signs reviewed and stable Respiratory status: spontaneous breathing, nonlabored ventilation and respiratory function stable Cardiovascular status: stable Postop Assessment: no headache, no backache, epidural receding, no apparent nausea or vomiting, patient able to bend at knees, able to ambulate and adequate PO intake Anesthetic complications: no   No notable events documented.  Last Vitals:  Vitals:   10/10/22 0725 10/10/22 0815  BP:  (!) 148/85  Pulse:  75  Resp: 16 18  Temp:  36.6 C  SpO2:  100%    Last Pain:  Vitals:   10/10/22 0815  TempSrc: Oral  PainSc: 0-No pain   Pain Goal: Patients Stated Pain Goal: 3 (10/10/22 0815)                 Jabier Mutton

## 2022-10-11 ENCOUNTER — Ambulatory Visit: Payer: Medicaid Other

## 2022-10-11 LAB — GLUCOSE, CAPILLARY
Glucose-Capillary: 91 mg/dL (ref 70–99)
Glucose-Capillary: 87 mg/dL (ref 70–99)
Glucose-Capillary: 110 mg/dL — ABNORMAL HIGH (ref 70–99)
Glucose-Capillary: 80 mg/dL (ref 70–99)

## 2022-10-11 LAB — COMPREHENSIVE METABOLIC PANEL
ALT: 9 U/L (ref 0–44)
AST: 17 U/L (ref 15–41)
Albumin: 2.1 g/dL — ABNORMAL LOW (ref 3.5–5.0)
Alkaline Phosphatase: 93 U/L (ref 38–126)
Anion gap: 8 (ref 5–15)
BUN: <5 mg/dL — ABNORMAL LOW (ref 6–20)
CO2: 21 mmol/L — ABNORMAL LOW (ref 22–32)
Calcium: 7.2 mg/dL — ABNORMAL LOW (ref 8.9–10.3)
Chloride: 107 mmol/L (ref 98–111)
Creatinine, Ser: 0.76 mg/dL (ref 0.44–1.00)
GFR, Estimated: >60 mL/min (ref >60)
Glucose, Bld: 103 mg/dL — ABNORMAL HIGH (ref 70–99)
Potassium: 3.5 mmol/L (ref 3.5–5.1)
Sodium: 136 mmol/L (ref 135–145)
Total Bilirubin: 0.2 mg/dL — ABNORMAL LOW (ref 0.3–1.2)
Total Protein: 5.3 g/dL — ABNORMAL LOW (ref 6.5–8.1)

## 2022-10-11 LAB — CBC
HCT: 22.9 % — ABNORMAL LOW (ref 36.0–46.0)
Hemoglobin: 7.2 g/dL — ABNORMAL LOW (ref 12.0–15.0)
MCH: 24 pg — ABNORMAL LOW (ref 26.0–34.0)
MCHC: 31.4 g/dL (ref 30.0–36.0)
MCV: 76.3 fL — ABNORMAL LOW (ref 80.0–100.0)
Platelets: 208 10*3/uL (ref 150–400)
RBC: 3 MIL/uL — ABNORMAL LOW (ref 3.87–5.11)
RDW: 16.5 % — ABNORMAL HIGH (ref 11.5–15.5)
WBC: 9 10*3/uL (ref 4.0–10.5)
nRBC: 0.3 % — ABNORMAL HIGH (ref 0.0–0.2)

## 2022-10-11 MED ORDER — SODIUM CHLORIDE 0.9 % IV SOLN
250.0000 mg | Freq: Every day | INTRAVENOUS | Status: DC
  Administered 2022-10-11: 250 mg via INTRAVENOUS
  Filled 2022-10-11 (×2): qty 20

## 2022-10-11 MED ORDER — NIFEDIPINE ER OSMOTIC RELEASE 60 MG PO TB24
60.0000 mg | ORAL_TABLET | Freq: Every day | ORAL | Status: DC
  Administered 2022-10-11 – 2022-10-12 (×2): 60 mg via ORAL
  Filled 2022-10-11 (×2): qty 1

## 2022-10-11 NOTE — Progress Notes (Signed)
Patient ID: Marilyn Hughes, female   DOB: 1991-01-08, 32 y.o.   MRN: [id number] Pt denies HA, RUQ pain or visual changes BP (!) 153/71 (BP Location: Right Arm)   Pulse 84   Temp 98.4 F (36.9 C) (Oral)   Resp 18   Ht 5' 4"$  (1.626 m)   Wt 86.2 kg   LMP 01/30/2022   SpO2 99%   Breastfeeding Unknown   BMI 32.61 kg/m  Physical Examination: General appearance - alert, well appearing, and in no distress Chest - clear to auscultation, no wheezes, rales or rhonchi, symmetric air entry Heart - normal rate and regular rhythm Abdomen - soft, nontender, nondistended, no masses or organomegaly Pelvic -  normal lochia Extremities - peripheral pulses normal, no pedal edema, no clubbing or cyanosis, Homan's sign negative bilaterally CBC    Component Value Date/Time   WBC 9.0 10/11/2022 0501   RBC 3.00 (L) 10/11/2022 0501   HGB 7.2 (L) 10/11/2022 0501   HCT 22.9 (L) 10/11/2022 0501   PLT 208 10/11/2022 0501   MCV 76.3 (L) 10/11/2022 0501   MCH 24.0 (L) 10/11/2022 0501   MCHC 31.4 10/11/2022 0501   RDW 16.5 (H) 10/11/2022 0501   LYMPHSABS 1.5 08/19/2022 2243   MONOABS 0.5 08/19/2022 2243   EOSABS 0.1 08/19/2022 2243   BASOSABS 0.0 08/19/2022 2243      Latest Ref Rng & Units 10/11/2022    5:01 AM 10/10/2022    3:40 AM 10/09/2022    5:29 PM  CMP  Glucose 70 - 99 mg/dL 103  206  128   BUN 6 - 20 mg/dL <5  <5  <5   Creatinine 0.44 - 1.00 mg/dL 0.76  0.61  0.62   Sodium 135 - 145 mmol/L 136  133  135   Potassium 3.5 - 5.1 mmol/L 3.5  3.0  3.0   Chloride 98 - 111 mmol/L 107  103  106   CO2 22 - 32 mmol/L 21  19  19   $ Calcium 8.9 - 10.3 mg/dL 7.2  6.4  6.9   Total Protein 6.5 - 8.1 g/dL 5.3  5.8  6.2   Total Bilirubin 0.3 - 1.2 mg/dL 0.2  0.3  0.1   Alkaline Phos 38 - 126 U/L 93  132  122   AST 15 - 41 U/L 17  23  18   $ ALT 0 - 44 U/L 9  9  9    $ PPD 2 with DM and PE Will give IV iron for anemia BS 117-205.  BS better with ISS and low carb diet Increase procardia to 60 mg Possible DC  tomorrow

## 2022-10-12 DIAGNOSIS — Z3490 Encounter for supervision of normal pregnancy, unspecified, unspecified trimester: Secondary | ICD-10-CM | POA: Diagnosis present

## 2022-10-12 DIAGNOSIS — E876 Hypokalemia: Secondary | ICD-10-CM | POA: Diagnosis present

## 2022-10-12 DIAGNOSIS — D509 Iron deficiency anemia, unspecified: Secondary | ICD-10-CM | POA: Diagnosis present

## 2022-10-12 LAB — GLUCOSE, CAPILLARY
Glucose-Capillary: 83 mg/dL (ref 70–99)
Glucose-Capillary: 144 mg/dL — ABNORMAL HIGH (ref 70–99)

## 2022-10-12 LAB — SURGICAL PATHOLOGY

## 2022-10-12 MED ORDER — OXYCODONE HCL 5 MG PO TABS: 5.0000 mg | ORAL_TABLET | ORAL | 0 refills | Status: DC | PRN

## 2022-10-12 MED ORDER — IBUPROFEN 600 MG PO TABS: 600.0000 mg | ORAL_TABLET | Freq: Four times a day (QID) | ORAL | 0 refills | Status: DC

## 2022-10-12 MED ORDER — FERROUS SULFATE 325 (65 FE) MG PO TABS: 325.0000 mg | ORAL_TABLET | Freq: Two times a day (BID) | ORAL | 3 refills | Status: DC

## 2022-10-12 MED ORDER — OXYCODONE HCL 5 MG PO TABS: 5.0000 mg | ORAL_TABLET | ORAL | Status: DC | PRN

## 2022-10-12 MED ORDER — NIFEDIPINE ER 60 MG PO TB24: 60.0000 mg | ORAL_TABLET | Freq: Every day | ORAL | 1 refills | Status: AC

## 2022-10-12 MED ORDER — SENNOSIDES-DOCUSATE SODIUM 8.6-50 MG PO TABS: 2.0000 | ORAL_TABLET | Freq: Every day | ORAL | 0 refills | Status: DC

## 2022-10-12 NOTE — Progress Notes (Signed)
Patient discharged, ambulatory off unit with sister. Patient given discharge instructions and denies any questions or concerns.

## 2022-10-12 NOTE — Discharge Summary (Signed)
SVD OB Discharge Summary       Patient Name: Marilyn Hughes DOB: Feb 08, 1991 MRN: [id number]  Date of admission: 10/08/2022 Delivering MD: Christophe Louis  Date of delivery: 10/09/2022 Type of delivery: SVD  Newborn Data: Sex: Baby female Live born female  Birth Weight: 5 lb 6.1 oz (2440 g) APGAR: 43, 9  Newborn Delivery   Birth date/time: 10/09/2022 22:23:43 Delivery type: Vaginal, Spontaneous      Feeding: breast and bottle Infant being discharge to home with mother in stable condition.   Admitting diagnosis: Gestational hypertension [O13.9] Intrauterine pregnancy: [redacted]w[redacted]d    Secondary diagnosis:  Principal Problem:   Preeclampsia, severe Active Problems:   History of pre-eclampsia in prior pregnancy, currently pregnant in third trimester   Pre-existing type 2 diabetes mellitus in pregnancy   Anemia of mother in pregnancy   Iron deficiency anemia   Normal postpartum course   Hypokalemia   Hypocalcemia   Encounter for induction of labor   SVD (spontaneous vaginal delivery)                                Complications: None                                                              Intrapartum Procedures: spontaneous vaginal delivery Postpartum Procedures: transfusion IV Iron for hgb of 7.2 Complications-Operative and Postpartum:  1st degree perineal laceration Augmentation: AROM, Pitocin, and Cytotec   History of Present Illness: Ms. Marilyn Dickermanis a 32y.o. female, [phone] who presents at 345w0deeks gestation. [facility]  Her pregnancy has been complicated by:  Patient Active Problem List   Diagnosis Date Noted   Iron deficiency anemia 10/12/2022   Normal postpartum course 10/12/2022   Hypokalemia 10/12/2022   Hypocalcemia 10/12/2022   Encounter for induction of labor 10/12/2022   SVD (spontaneous vaginal delivery) 10/12/2022   History of pre-eclampsia in prior pregnancy, currently pregnant in  third trimester 10/08/2022   Pre-existing type 2 diabetes mellitus in pregnancy 10/08/2022   Anemia of mother in pregnancy 10/08/2022   Preeclampsia, severe 10/08/2022     Active Ambulatory Problems    Diagnosis Date Noted   No Active Ambulatory Problems   Resolved Ambulatory Problems    Diagnosis Date Noted   No Resolved Ambulatory Problems   Past Medical History:  Diagnosis Date   Diabetes mellitus without complication ([facility]   Pregnancy induced hypertension    Vaginal Pap smear, abnormal      Hospital course:  Induction of Labor With Vaginal Delivery   [age].o. yo [id number] [id number] admitted to the hospital 10/08/2022 for induction of labor.  Indication for induction: Preeclampsia.  Patient had an labor course complicated bypt was admitted on 2/9 for IOL at 35.6 weeks for PreE with SF BP, tx with mag, started on procardia 16m80m, admitted PCR was 1.81, creatine wnl, calcemia noted to be critically low at 6.4 on 2/11 corrected with po oscal, potassium noted to be 3.0 and corrected with 20me26mD PO kdur. Also had dm2 on metformin 1000mg 65mwith SSI during labor. Pt had SVD on 2/10 @ 2223 after  cytotec, pitocin and AROM, had viable baby female, over 1st degree laceration, with ebl of 47ms, hgb drop of 7.8-8.3-8.9-8.6-7.2, asymptomatic.  Membrane Rupture Time/Date: 5:06 PM ,10/09/2022   Delivery Method:Vaginal, Spontaneous  Episiotomy: None  Lacerations:  1st degree;Vaginal  Details of delivery can be found in separate delivery note.  Patient had a postpartum course complicated by s/p 24 hours mag PP. PP pt had elevated cbg and required SSI for coverage but better with diet improvmeents, will continue metformin 10075mBID and f/u PP. IV Iron was given 25017m/12 for hgb of 7.2, although asymptomatic. Patient is discharged home 10/12/22.  Newborn Data: Birth date:10/09/2022  Birth time:10:23 PM  Gender:Female  Living status:Living  Apgars:8 ,9  Weight:2440 g   Review the Delivery  Report for details.   Postpartum Day # 3 :  Patient up ad lib, denies syncope or dizziness. Reports consuming regular diet without issues and denies N/V. Patient reports 0 bowel movement + passing flatus.  Denies issues with urination and reports bleeding is "lighter."  Patient is breastfeeding and reports going well.  Desires BTL at 6 weeks PP for postpartum contraception.  Pain is being appropriately managed with use of po meds.   Physical exam  Vitals:   10/11/22 1956 10/11/22 2328 10/12/22 0338 10/12/22 0823  BP: (!) 152/89 131/75 127/68 129/80  Pulse: (!) 112 99 (!) 108 (!) 107  Resp: 16 16 16 16  $ Temp: 98.9 F (37.2 C) 98.3 F (36.8 C) 98.4 F (36.9 C) 98.2 F (36.8 C)  TempSrc: Oral Oral Oral Oral  SpO2: 99% 100% 100% 100%  Weight:      Height:       General: alert, cooperative, and no distress Lochia: appropriate Uterine Fundus: firm Perineum: intact DVT Evaluation: No evidence of DVT seen on physical exam. Negative Homan's sign. No cords or calf tenderness. No significant calf/ankle edema.  Labs: Lab Results  Component Value Date   WBC 9.0 10/11/2022   HGB 7.2 (L) 10/11/2022   HCT 22.9 (L) 10/11/2022   MCV 76.3 (L) 10/11/2022   PLT 208 10/11/2022      Latest Ref Rng & Units 10/11/2022    5:01 AM  CMP  Glucose 70 - 99 mg/dL 103   BUN 6 - 20 mg/dL <5   Creatinine 0.44 - 1.00 mg/dL 0.76   Sodium 135 - 145 mmol/L 136   Potassium 3.5 - 5.1 mmol/L 3.5   Chloride 98 - 111 mmol/L 107   CO2 22 - 32 mmol/L 21   Calcium 8.9 - 10.3 mg/dL 7.2   Total Protein 6.5 - 8.1 g/dL 5.3   Total Bilirubin 0.3 - 1.2 mg/dL 0.2   Alkaline Phos 38 - 126 U/L 93   AST 15 - 41 U/L 17   ALT 0 - 44 U/L 9     Date of discharge: 10/12/2022 Discharge Diagnoses: Preelampsia and preterm delivery  Discharge instruction: per After Visit Summary and "Baby and Me Booklet".  After visit meds:   Activity:           unrestricted and pelvic rest Advance as tolerated. Pelvic rest for 6 weeks.   Diet:                routine Medications: PNV, Ibuprofen, Colace, and Iron Postpartum contraception: Tubal Ligation Condition:  Pt discharge to home with baby in stable Anemia: PO iron PreE; Monitor BP, continue procardia 29m31m.  DM: continue metformin 1000mg21m PO .   Meds: Allergies as  of 10/12/2022   No Known Allergies      Medication List     STOP taking these medications    acetaminophen 650 MG CR tablet Commonly known as: TYLENOL   aspirin 81 MG chewable tablet   Doxylamine-Pyridoxine 10-10 MG Tbec Commonly known as: Diclegis   Insulin Pen Needle 31G X 5 MM Misc   Lantus SoloStar 100 UNIT/ML Solostar Pen Generic drug: insulin glargine   ondansetron 4 MG disintegrating tablet Commonly known as: ZOFRAN-ODT   prenatal multivitamin Tabs tablet   promethazine 12.5 MG tablet Commonly known as: PHENERGAN       TAKE these medications    ferrous sulfate 325 (65 FE) MG tablet Take 1 tablet (325 mg total) by mouth 2 (two) times daily with a meal. What changed: when to take this   ibuprofen 600 MG tablet Commonly known as: ADVIL Take 1 tablet (600 mg total) by mouth every 6 (six) hours.   metFORMIN 1000 MG tablet Commonly known as: GLUCOPHAGE Take 1,000 mg by mouth 2 (two) times daily with a meal.   NIFEdipine 60 MG 24 hr tablet Commonly known as: ADALAT CC Take 1 tablet (60 mg total) by mouth daily. Start taking on: October 13, 2022   oxyCODONE 5 MG immediate release tablet Commonly known as: Oxy IR/ROXICODONE Take 1 tablet (5 mg total) by mouth every 4 (four) hours as needed for severe pain.   senna-docusate 8.6-50 MG tablet Commonly known as: Senokot-S Take 2 tablets by mouth daily. Start taking on: October 13, 2022               Discharge Care Instructions  (From admission, onward)           Start     Ordered   10/12/22 0000  Discharge wound care:       Comments: Take dressing off on day 5-7 postpartum.  Report increased  drainage, redness or warmth. Clean with water, let soap trickle down body. Can leave steri strips on until they fall off or take them off gently at day 10. Keep open to air, clean and dry.   10/12/22 1415            Discharge Follow Up:   Follow-up [facility]. Schedule an appointment as soon as possible for a visit in 1 week(s).   Specialty: Obstetrics and Gynecology Why: 1 week BP check and 6 week PPV Contact information: [address] [place] [phone] [place], [place], PMHNP-BC  [address]  [place], [place] [address]  Cell: [phone]  Office Phone: [phone] Fax: [phone] 10/12/2022  2:16 PM

## 2022-10-12 NOTE — Social Work (Signed)
Patient screened out for psychosocial assessment since none of the following apply:  Psychosocial stressors documented in mother or baby's chart Gestation less than 32 weeks Code at delivery  Infant with anomalies Please contact the Clinical Social Worker if specific needs arise, by MOB's request, or if MOB scores greater than 9/yes to question 10 on [place] Postpartum Depression Screen.  Kathrin Greathouse, MSW, LCSW [facility] [place] Worker  10/12/2022  9:54 AM

## 2022-10-18 ENCOUNTER — Ambulatory Visit: Payer: Medicaid Other

## 2022-10-20 ENCOUNTER — Telehealth (HOSPITAL_COMMUNITY): Payer: Self-pay | Admitting: *Deleted

## 2022-10-20 NOTE — Telephone Encounter (Signed)
Attempted hospital discharge follow-up call. Left message for patient to return RN call with any questions or concerns. Erline Levine, RN, 10/20/22, [phone]

## 2022-11-25 ENCOUNTER — Ambulatory Visit: Payer: Medicaid Other | Admitting: Internal Medicine

## 2022-12-08 ENCOUNTER — Ambulatory Visit: Admit: 2022-12-08 | Payer: Medicaid Other

## 2022-12-08 ENCOUNTER — Ambulatory Visit
Admission: EM | Admit: 2022-12-08 | Discharge: 2022-12-08 | Disposition: A | Discharge status: Home / Self Care | Payer: Medicaid Other | Attending: Nurse Practitioner | Admitting: Nurse Practitioner

## 2022-12-08 DIAGNOSIS — B349 Viral infection, unspecified: Secondary | ICD-10-CM | POA: Diagnosis not present

## 2022-12-08 DIAGNOSIS — R051 Acute cough: Secondary | ICD-10-CM | POA: Insufficient documentation

## 2022-12-08 DIAGNOSIS — U071 COVID-19: Secondary | ICD-10-CM | POA: Insufficient documentation

## 2022-12-08 MED ORDER — PROMETHAZINE-DM 6.25-15 MG/5ML PO SYRP: 5.0000 mL | ORAL_SOLUTION | Freq: Four times a day (QID) | ORAL | 0 refills | Status: DC | PRN

## 2022-12-08 NOTE — Discharge Instructions (Signed)
The clinic will contact you with results of your COVID testing done today if positive Promethazine DM as needed for cough.  Please note this medication can make you drowsy.  Do not drink alcohol or drive on this medication Over-the-counter Tylenol or ibuprofen as needed for fever/headache Rest and increase your fluids Follow-up with your PCP if your symptoms or not improving Please go to the emergency room if you have any worsening symptoms

## 2022-12-08 NOTE — ED Provider Notes (Signed)
[facility]    CSN: [id number] Arrival date & time: 12/08/22  1854      History   Chief Complaint Chief Complaint  Patient presents with   Cough    HPI Marilyn Hughes is a 32 y.o. female  presents for evaluation of URI symptoms for 2-3 days. Patient reports associated symptoms of cough, congestion, headache, body aches, chills. Denies N/V/D, sore throat, fevers, ear pain, shortness of breath. Patient does not have a hx of asthma or smoking. No known sick contacts.  Pt has taken Excedrin OTC for symptoms.  Patient states she is not breast-feeding and denies pregnancy.  Pt has no other concerns at this time.    Cough Associated symptoms: headaches     Past Medical History:  Diagnosis Date   Diabetes mellitus without complication    Type 2   Pregnancy induced hypertension    Vaginal Pap smear, abnormal     Patient Active Problem List   Diagnosis Date Noted   Iron deficiency anemia 10/12/2022   Normal postpartum course 10/12/2022   Hypokalemia 10/12/2022   Hypocalcemia 10/12/2022   Encounter for induction of labor 10/12/2022   SVD (spontaneous vaginal delivery) 10/12/2022   History of pre-eclampsia in prior pregnancy, currently pregnant in third trimester 10/08/2022   Pre-existing type 2 diabetes mellitus in pregnancy 10/08/2022   Anemia of mother in pregnancy 10/08/2022   Preeclampsia, severe 10/08/2022    Past Surgical History:  Procedure Laterality Date   back sx  2005   scoliosis    HERNIA REPAIR      OB History     Gravida  4   Para  3   Term  1   Preterm  2   AB  1   Living  3      SAB      IAB      Ectopic  1   Multiple  0   Live Births  3            Home Medications    Prior to Admission medications   Medication Sig Start Date End Date Taking? Authorizing Provider  promethazine-dextromethorphan (PROMETHAZINE-DM) 6.25-15 MG/5ML syrup Take 5 mLs by mouth 4 (four) times daily as needed for cough. 12/08/22  Yes Radford Pax, NP  ferrous sulfate 325 (65 FE) MG tablet Take 1 tablet (325 mg total) by mouth 2 (two) times daily with a meal. 10/12/22   [place], Lesly Rubenstein, FNP  ibuprofen (ADVIL) 600 MG tablet Take 1 tablet (600 mg total) by mouth every 6 (six) hours. 10/12/22   Dale Sylvanite, FNP  metFORMIN (GLUCOPHAGE) 1000 MG tablet Take 1,000 mg by mouth 2 (two) times daily with a meal.    [provider]  NIFEdipine (ADALAT CC) 60 MG 24 hr tablet Take 1 tablet (60 mg total) by mouth daily. 10/13/22   [place], Lesly Rubenstein, FNP  senna-docusate (SENOKOT-S) 8.6-50 MG tablet Take 2 tablets by mouth daily. 10/13/22   Dale Movico, FNP    Family History Family History  Problem Relation Age of Onset   Hypertension Mother    Diabetes Mother     Social History Social History   Tobacco Use   Smoking status: Never   Smokeless tobacco: Never  Vaping Use   Vaping Use: Never used  Substance Use Topics   Alcohol use: Not Currently   Drug use: Never     Allergies   Patient has no known allergies.   Review of Systems Review of  Systems  HENT:  Positive for congestion.   Respiratory:  Positive for cough.   Neurological:  Positive for headaches.     Physical Exam Triage Vital Signs ED Triage Vitals [12/08/22 1908]  Enc Vitals Group     BP (!) 168/93     Pulse Rate (!) 110     Resp 18     Temp 100.1 F (37.8 C)     Temp Source Oral     SpO2 97 %     Weight      Height      Head Circumference      Peak Flow      Pain Score 7     Pain Loc      Pain Edu?      Excl. in GC?    No data found.  Updated Vital Signs BP (!) 168/93   Pulse (!) 110   Temp 100.1 F (37.8 C) (Oral)   Resp 18   LMP 01/30/2022   SpO2 97%   Breastfeeding No   Visual Acuity Right Eye Distance:   Left Eye Distance:   Bilateral Distance:    Right Eye Near:   Left Eye Near:    Bilateral Near:     Physical Exam Vitals and nursing note reviewed.  Constitutional:      General: She is not in acute distress.     Appearance: She is well-developed. She is not ill-appearing.  HENT:     Head: Normocephalic and atraumatic.     Right Ear: Tympanic membrane and ear canal normal.     Left Ear: Tympanic membrane and ear canal normal.     Nose: Congestion present.     Mouth/Throat:     Mouth: Mucous membranes are moist.     Pharynx: Oropharynx is clear. Uvula midline. No oropharyngeal exudate or posterior oropharyngeal erythema.     Tonsils: No tonsillar exudate or tonsillar abscesses.  Eyes:     Conjunctiva/sclera: Conjunctivae normal.     Pupils: Pupils are equal, round, and reactive to light.  Cardiovascular:     Rate and Rhythm: Regular rhythm. Tachycardia present.     Heart sounds: Normal heart sounds.     Comments: Mildly tacky at 110 in setting of fever Pulmonary:     Effort: Pulmonary effort is normal.     Breath sounds: Normal breath sounds.  Musculoskeletal:     Cervical back: Normal range of motion and neck supple.  Lymphadenopathy:     Cervical: No cervical adenopathy.  Skin:    General: Skin is warm and dry.  Neurological:     General: No focal deficit present.     Mental Status: She is alert and oriented to person, place, and time.  Psychiatric:        Mood and Affect: Mood normal.        Behavior: Behavior normal.      UC Treatments / Results  Labs (all labs ordered are listed, but only abnormal results are displayed) Labs Reviewed  SARS CORONAVIRUS 2 (TAT 6-24 HRS)    EKG   Radiology No results found.  Procedures Procedures (including critical care time)  Medications Ordered in UC Medications - No data to display  Initial Impression / Assessment and Plan / UC Course  I have reviewed the triage vital signs and the nursing notes.  Pertinent labs & imaging results that were available during my care of the patient were reviewed by me and considered in my medical decision  making (see chart for details).     Reviewed exam and symptoms with patient.  No red  flags. Discussed viral illness and symptomatic treatment COVID PCR and will contact if positive Promethazine DM as needed for cough.  Side effect profile reviewed OTC analgesics for fever reduction/headache PCP follow-up if symptoms do not improve ER precautions reviewed and patient verbalized understanding Final Clinical Impressions(s) / UC Diagnoses   Final diagnoses:  Acute cough  Viral illness     Discharge Instructions      The clinic will contact you with results of your COVID testing done today if positive Promethazine DM as needed for cough.  Please note this medication can make you drowsy.  Do not drink alcohol or drive on this medication Over-the-counter Tylenol or ibuprofen as needed for fever/headache Rest and increase your fluids Follow-up with your PCP if your symptoms or not improving Please go to the emergency room if you have any worsening symptoms   ED Prescriptions     Medication Sig Dispense Auth. Provider   promethazine-dextromethorphan (PROMETHAZINE-DM) 6.25-15 MG/5ML syrup Take 5 mLs by mouth 4 (four) times daily as needed for cough. 118 mL Radford PaxMayer, Jodi R, NP      PDMP not reviewed this encounter.   Radford PaxMayer, Jodi R, NP 12/08/22 [phone]

## 2022-12-08 NOTE — ED Triage Notes (Signed)
Pt c/o body aches, chills, headache, and cough x2 days. States took Excedrin an hour ago with relief.

## 2022-12-09 ENCOUNTER — Telehealth (HOSPITAL_COMMUNITY): Payer: Self-pay | Admitting: Emergency Medicine

## 2022-12-09 LAB — SARS CORONAVIRUS 2 (TAT 6-24 HRS): SARS Coronavirus 2: POSITIVE — AB

## 2023-01-10 ENCOUNTER — Ambulatory Visit: Payer: Medicaid Other | Admitting: Family Medicine

## 2023-01-10 ENCOUNTER — Other Ambulatory Visit: Payer: Self-pay | Admitting: Family Medicine

## 2023-01-10 ENCOUNTER — Ambulatory Visit: Payer: Medicaid Other | Admitting: Internal Medicine

## 2023-01-10 NOTE — Telephone Encounter (Signed)
Medication Refill - Medication: metFORMIN (GLUCOPHAGE) 1000 MG tablet   Has the patient contacted their pharmacy? No. No, more refills.  (Agent: If no, request that the patient contact the pharmacy for the refill. If patient does not wish to contact the pharmacy document the reason why and proceed with request.)   Preferred Pharmacy (with phone number or street name):  [id number] [place], [place] - [address]  [address] [place] [address]  Phone: [phone] Fax: [phone]  Hours: Not open 24 hours   Has the patient been seen for an appointment in the last year OR does the patient have an upcoming appointment? Yes.    Agent: Please be advised that RX refills may take up to 3 business days. We ask that you follow-up with your pharmacy.

## 2023-01-11 MED ORDER — METFORMIN HCL 1000 MG PO TABS: 1000.0000 mg | ORAL_TABLET | Freq: Two times a day (BID) | ORAL | 0 refills | Status: DC

## 2023-01-11 NOTE — Telephone Encounter (Signed)
Requested medication (s) are due for refill today: Yes  Requested medication (s) are on the active medication list: Yes  Last refill:  07/02/22  Future visit scheduled: Yes  Notes to clinic:  Historical provider.    Requested Prescriptions  Pending Prescriptions Disp Refills   metFORMIN (GLUCOPHAGE) 1000 MG tablet      Sig: Take 1 tablet (1,000 mg total) by mouth 2 (two) times daily with a meal.     Endocrinology:  Diabetes - Biguanides Failed - 01/10/2023  4:27 PM      Failed - B12 Level in normal range and within 720 days    No results found for: "VITAMINB12"       Failed - Valid encounter within last 6 months    Recent Outpatient Visits           1 year ago Type 2 diabetes mellitus with other specified complication, without long-term current use of insulin (HCC)   [facility] [place], Odette Horns, MD   2 years ago Neck swelling   [facility] [place], Odette Horns, MD       Future Appointments             In 2 weeks Hoy Register, MD [facility]            Passed - Cr in normal range and within 360 days    Creatinine, Ser  Date Value Ref Range Status  10/11/2022 0.76 0.44 - 1.00 mg/dL Final   Creatinine, Urine  Date Value Ref Range Status  10/08/2022 31 mg/dL Final         Passed - HBA1C is between 0 and 7.9 and within 180 days    Hgb A1c MFr Bld  Date Value Ref Range Status  10/08/2022 6.6 (H) 4.8 - 5.6 % Final    Comment:    (NOTE) Pre diabetes:          5.7%-6.4%  Diabetes:              >6.4%  Glycemic control for   <7.0% adults with diabetes          Passed - eGFR in normal range and within 360 days    GFR calc Af Amer  Date Value Ref Range Status  09/08/2020 119 >59 mL/min/1.73 Final    Comment:    **In accordance with recommendations from the NKF-ASN Task force,**   [facility] is in the process of updating its eGFR calculation to the   2021  CKD-EPI creatinine equation that estimates kidney function   without a race variable.    GFR, Estimated  Date Value Ref Range Status  10/11/2022 >60 >60 mL/min Final    Comment:    (NOTE) Calculated using the CKD-EPI Creatinine Equation (2021)          Passed - CBC within normal limits and completed in the last 12 months    WBC  Date Value Ref Range Status  10/11/2022 9.0 4.0 - 10.5 K/uL Final   RBC  Date Value Ref Range Status  10/11/2022 3.00 (L) 3.87 - 5.11 MIL/uL Final   Hemoglobin  Date Value Ref Range Status  10/11/2022 7.2 (L) 12.0 - 15.0 g/dL Final    Comment:    Reticulocyte Hemoglobin testing may be clinically indicated, consider ordering this additional test [id number]    HCT  Date Value Ref Range Status  10/11/2022 22.9 (L) 36.0 - 46.0 %  Final   MCHC  Date Value Ref Range Status  10/11/2022 31.4 30.0 - 36.0 g/dL Final   [facility]  Date Value Ref Range Status  10/11/2022 24.0 (L) 26.0 - 34.0 pg Final   MCV  Date Value Ref Range Status  10/11/2022 76.3 (L) 80.0 - 100.0 fL Final   No results found for: "PLTCOUNTKUC", "LABPLAT", "POCPLA" RDW  Date Value Ref Range Status  10/11/2022 16.5 (H) 11.5 - 15.5 % Final

## 2023-01-12 ENCOUNTER — Ambulatory Visit: Payer: Medicaid Other | Admitting: Family Medicine

## 2023-01-20 NOTE — Progress Notes (Signed)
Surgery orders requested via Epic inbox. °

## 2023-01-25 ENCOUNTER — Ambulatory Visit: Payer: Medicaid Other | Attending: Family Medicine | Admitting: Family Medicine

## 2023-01-25 ENCOUNTER — Encounter: Payer: Self-pay | Admitting: Family Medicine

## 2023-01-25 VITALS — BP 132/85 | HR 80 | Temp 98.0°F | Ht 64.0 in | Wt 183.8 lb

## 2023-01-25 DIAGNOSIS — D649 Anemia, unspecified: Secondary | ICD-10-CM

## 2023-01-25 DIAGNOSIS — E042 Nontoxic multinodular goiter: Secondary | ICD-10-CM | POA: Diagnosis not present

## 2023-01-25 DIAGNOSIS — Z7984 Long term (current) use of oral hypoglycemic drugs: Secondary | ICD-10-CM | POA: Diagnosis not present

## 2023-01-25 DIAGNOSIS — E1169 Type 2 diabetes mellitus with other specified complication: Secondary | ICD-10-CM | POA: Diagnosis not present

## 2023-01-25 LAB — POCT GLYCOSYLATED HEMOGLOBIN (HGB A1C): HbA1c, POC (controlled diabetic range): 6.1 % (ref 0.0–7.0)

## 2023-01-25 MED ORDER — OZEMPIC (0.25 OR 0.5 MG/DOSE) 2 MG/1.5ML ~~LOC~~ SOPN: 0.2500 mg | PEN_INJECTOR | SUBCUTANEOUS | 6 refills | Status: AC

## 2023-01-25 MED ORDER — METFORMIN HCL 500 MG PO TABS: 500.0000 mg | ORAL_TABLET | Freq: Two times a day (BID) | ORAL | 1 refills | Status: AC

## 2023-01-25 NOTE — Progress Notes (Signed)
Subjective:  Patient ID: Marilyn Hughes, female    DOB: [birth year]  Age: 32 y.o. MRN: [id number]  CC: Diabetes   HPI Marilyn Hughes is a 32 y.o. year old female with a history of multinodular thyroid, type 2 diabetes mellitus (A1c 6.1)   Interval History:  She is being worked up for robotic assisted salpingectomy next month but she is thinking of cancelling her procedure as her husband will be undergoing vasectomy.  She presents today and would like to restart Ozempic as it helped her in the past with her appetite.  At the moment she is on metformin at 1000 mg twice daily.  Denies presence of hypoglycemia. Fasting sugars are around 120 with the lowest at 95. Highest sugar was 180 and has occurred on some mornings once a week. She also eats sweets a lot at bedtime.  She has not been taking Nifedipine since she had her baby.  Her chart does reveal she had preeclampsia during pregnancy. Past Medical History:  Diagnosis Date   Diabetes mellitus without complication (HCC)    Type 2   Pregnancy induced hypertension    Vaginal Pap smear, abnormal     Past Surgical History:  Procedure Laterality Date   back sx  2005   scoliosis    HERNIA REPAIR      Family History  Problem Relation Age of Onset   Hypertension Mother    Diabetes Mother     Social History   Socioeconomic History   Marital status: Single    Spouse name: Not on file   Number of children: Not on file   Years of education: Not on file   Highest education level: 9th grade  Occupational History   Not on file  Tobacco Use   Smoking status: Never   Smokeless tobacco: Never  Vaping Use   Vaping Use: Never used  Substance and Sexual Activity   Alcohol use: Not Currently   Drug use: Never   Sexual activity: Yes  Other Topics Concern   Not on file  Social History Narrative   Not on file   Social Determinants of Health   Financial Resource Strain: High Risk (01/24/2023)   Overall Financial Resource Strain  (CARDIA)    Difficulty of Paying Living Expenses: Hard  Food Insecurity: No Food Insecurity (01/24/2023)   Hunger Vital Sign    Worried About Running Out of Food in the Last Year: Never true    Ran Out of Food in the Last Year: Never true  Transportation Needs: No Transportation Needs (01/24/2023)   PRAPARE - Administrator, Civil Service (Medical): No    Lack of Transportation (Non-Medical): No  Recent Concern: Transportation Needs - Unmet Transportation Needs (01/06/2023)   PRAPARE - Transportation    Lack of Transportation (Medical): Yes    Lack of Transportation (Non-Medical): Yes  Physical Activity: Insufficiently Active (01/24/2023)   Exercise Vital Sign    Days of Exercise per Week: 1 day    Minutes of Exercise per Session: 20 min  Stress: No Stress Concern Present (01/24/2023)   [employer] of Occupational Health - Occupational Stress Questionnaire    Feeling of Stress : Not at all  Social Connections: Moderately Integrated (01/24/2023)   Social Connection and Isolation Panel [NHANES]    Frequency of Communication with Friends and Family: More than three times a week    Frequency of Social Gatherings with Friends and Family: More than three times a week    Attends  Religious Services: More than 4 times per year    Active Member of Clubs or Organizations: No    Attends Banker Meetings: Not on file    Marital Status: Married    No Known Allergies  Outpatient Medications Prior to Visit  Medication Sig Dispense Refill   metFORMIN (GLUCOPHAGE) 1000 MG tablet Take 1 tablet (1,000 mg total) by mouth 2 (two) times daily with a meal. 180 tablet 0   NIFEdipine (ADALAT CC) 60 MG 24 hr tablet Take 1 tablet (60 mg total) by mouth daily. (Patient not taking: Reported on 01/25/2023) 30 tablet 1   ferrous sulfate 325 (65 FE) MG tablet Take 1 tablet (325 mg total) by mouth 2 (two) times daily with a meal. (Patient not taking: Reported on 01/25/2023) 30 tablet 3    ibuprofen (ADVIL) 600 MG tablet Take 1 tablet (600 mg total) by mouth every 6 (six) hours. (Patient not taking: Reported on 01/25/2023) 30 tablet 0   promethazine-dextromethorphan (PROMETHAZINE-DM) 6.25-15 MG/5ML syrup Take 5 mLs by mouth 4 (four) times daily as needed for cough. (Patient not taking: Reported on 01/25/2023) 118 mL 0   senna-docusate (SENOKOT-S) 8.6-50 MG tablet Take 2 tablets by mouth daily. (Patient not taking: Reported on 01/25/2023) 30 tablet 0   No facility-administered medications prior to visit.     ROS Review of Systems  Constitutional:  Negative for activity change and appetite change.  HENT:  Negative for sinus pressure and sore throat.   Respiratory:  Negative for chest tightness, shortness of breath and wheezing.   Cardiovascular:  Negative for chest pain and palpitations.  Gastrointestinal:  Negative for abdominal distention, abdominal pain and constipation.  Genitourinary: Negative.   Musculoskeletal: Negative.   Psychiatric/Behavioral:  Negative for behavioral problems and dysphoric mood.     Objective:  BP 132/85   Pulse 80   Temp 98 F (36.7 C) (Oral)   Ht 5\' 4"  (1.626 m)   Wt 183 lb 12.8 oz (83.4 kg)   SpO2 100%   BMI 31.55 kg/m      01/25/2023    1:39 PM 12/08/2022    7:08 PM 10/12/2022    3:20 PM  BP/Weight  Systolic BP 132 168 140  Diastolic BP 85 93 89  Wt. (Lbs) 183.8    BMI 31.55 kg/m2        Physical Exam Constitutional:      Appearance: She is well-developed.  Cardiovascular:     Rate and Rhythm: Normal rate.     Heart sounds: Normal heart sounds. No murmur heard. Pulmonary:     Effort: Pulmonary effort is normal.     Breath sounds: Normal breath sounds. No wheezing or rales.  Chest:     Chest wall: No tenderness.  Abdominal:     General: Bowel sounds are normal. There is no distension.     Palpations: Abdomen is soft. There is no mass.     Tenderness: There is no abdominal tenderness.  Musculoskeletal:        General:  Normal range of motion.     Right lower leg: No edema.     Left lower leg: No edema.  Neurological:     Mental Status: She is alert and oriented to person, place, and time.  Psychiatric:        Mood and Affect: Mood normal.        Latest Ref Rng & Units 10/11/2022    5:01 AM 10/10/2022    3:40 AM 10/09/2022  5:29 PM  CMP  Glucose 70 - 99 mg/dL 213  086  578   BUN 6 - 20 mg/dL <5  <5  <5   Creatinine 0.44 - 1.00 mg/dL 4.69  6.29  5.28   Sodium 135 - 145 mmol/L 136  133  135   Potassium 3.5 - 5.1 mmol/L 3.5  3.0  3.0   Chloride 98 - 111 mmol/L 107  103  106   CO2 22 - 32 mmol/L 21  19  19    Calcium 8.9 - 10.3 mg/dL 7.2  6.4  6.9   Total Protein 6.5 - 8.1 g/dL 5.3  5.8  6.2   Total Bilirubin 0.3 - 1.2 mg/dL 0.2  0.3  0.1   Alkaline Phos 38 - 126 U/L 93  132  122   AST 15 - 41 U/L 17  23  18    ALT 0 - 44 U/L 9  9  9      Lipid Panel  No results found for: "CHOL", "TRIG", "HDL", "CHOLHDL", "VLDL", "LDLCALC", "LDLDIRECT"  CBC    Component Value Date/Time   WBC 9.0 10/11/2022 0501   RBC 3.00 (L) 10/11/2022 0501   HGB 7.2 (L) 10/11/2022 0501   HCT 22.9 (L) 10/11/2022 0501   PLT 208 10/11/2022 0501   MCV 76.3 (L) 10/11/2022 0501   MCH 24.0 (L) 10/11/2022 0501   MCHC 31.4 10/11/2022 0501   RDW 16.5 (H) 10/11/2022 0501   LYMPHSABS 1.5 08/19/2022 2243   MONOABS 0.5 08/19/2022 2243   EOSABS 0.1 08/19/2022 2243   BASOSABS 0.0 08/19/2022 2243    Lab Results  Component Value Date   HGBA1C 6.1 01/25/2023   Lab Results  Component Value Date   TSH 0.94 11/19/2021     Assessment & Plan:  1. Type 2 diabetes mellitus with other specified complication, without long-term current use of insulin (HCC) Controlled with A1c of 6.1 Ozempic initiated Metformin dose decreased prevent hypoglycemia Counseled on Diabetic diet, my plate method, 413 minutes of moderate intensity exercise/week Blood sugar logs with fasting goals of 80-120 mg/dl, random of less than 244 and in the event of  sugars less than 60 mg/dl or greater than 010 mg/dl encouraged to notify the clinic. Advised on the need for annual eye exams, annual foot exams, Pneumonia vaccine.  - POCT glycosylated hemoglobin (Hb A1C) - Microalbumin/Creatinine Ratio, Urine - Semaglutide,0.25 or 0.5MG /DOS, (OZEMPIC, 0.25 OR 0.5 MG/DOSE,) 2 MG/1.5ML SOPN; Inject 0.25 mg into the skin once a week.  Dispense: 2 mL; Refill: 6 - CMP14+EGFR - metFORMIN (GLUCOPHAGE) 500 MG tablet; Take 1 tablet (500 mg total) by mouth 2 (two) times daily with a meal.  Dispense: 180 tablet; Refill: 1  2. Anemia, unspecified type Likely blood loss anemia secondary to childbirth Will check hemoglobin again Last hemoglobin was 7.2 in 09/2022 - CBC with Differential/Platelet  3. Multinodular thyroid Last thyroid labs were normal She is currently not on medication. - T4, free - TSH - T3   Health Care Maintenance: She states she is up-to-date with her Pap smear from [facility] . No orders of the defined types were placed in this encounter.   Follow-up: Return in about 1 month (around 02/25/2023) for Pap smear.       Hoy Register, MD, FAAFP. [facility] [place], [place] [phone]   01/25/2023, 1:57 PM

## 2023-01-25 NOTE — Progress Notes (Signed)
Restart ozempic

## 2023-01-25 NOTE — Patient Instructions (Signed)
Semaglutide Injection What is this medication? SEMAGLUTIDE (SEM a GLOO tide) treats type 2 diabetes. It works by increasing insulin levels in your body, which decreases your blood sugar (glucose). It also reduces the amount of sugar released into the blood and slows down your digestion. It can also be used to lower the risk of heart attack and stroke in people with type 2 diabetes. Changes to diet and exercise are often combined with this medication. This medicine may be used for other purposes; ask your health care provider or pharmacist if you have questions. COMMON BRAND NAME(S): OZEMPIC What should I tell my care team before I take this medication? They need to know if you have any of these conditions: Endocrine tumors (MEN 2) or if someone in your family had these tumors Eye disease, vision problems History of pancreatitis Kidney disease Stomach problems Thyroid cancer or if someone in your family had thyroid cancer An unusual or allergic reaction to semaglutide, other medications, foods, dyes, or preservatives Pregnant or trying to get pregnant Breast-feeding How should I use this medication? This medication is for injection under the skin of your upper leg (thigh), stomach area, or upper arm. It is given once every week (every 7 days). You will be taught how to prepare and give this medication. Use exactly as directed. Take your medication at regular intervals. Do not take it more often than directed. If you use this medication with insulin, you should inject this medication and the insulin separately. Do not mix them together. Do not give the injections right next to each other. Change (rotate) injection sites with each injection. It is important that you put your used needles and syringes in a special sharps container. Do not put them in a trash can. If you do not have a sharps container, call your pharmacist or care team to get one. A special MedGuide will be given to you by the  pharmacist with each prescription and refill. Be sure to read this information carefully each time. This medication comes with INSTRUCTIONS FOR USE. Ask your pharmacist for directions on how to use this medication. Read the information carefully. Talk to your pharmacist or care team if you have questions. Talk to your care team about the use of this medication in children. Special care may be needed. Overdosage: If you think you have taken too much of this medicine contact a poison control center or emergency room at once. NOTE: This medicine is only for you. Do not share this medicine with others. What if I miss a dose? If you miss a dose, take it as soon as you can within 5 days after the missed dose. Then take your next dose at your regular weekly time. If it has been longer than 5 days after the missed dose, do not take the missed dose. Take the next dose at your regular time. Do not take double or extra doses. If you have questions about a missed dose, contact your care team for advice. What may interact with this medication? Other medications for diabetes Many medications may cause changes in blood sugar, these include: Alcohol containing beverages Antiviral medications for HIV or AIDS Aspirin and aspirin-like medications Certain medications for blood pressure, heart disease, irregular heart beat Chromium Diuretics Female hormones, such as estrogens or progestins, birth control pills Fenofibrate Gemfibrozil Isoniazid Lanreotide Female hormones or anabolic steroids MAOIs like Carbex, Eldepryl, Marplan, Nardil, and Parnate Medications for weight loss Medications for allergies, asthma, cold, or cough Medications for depression,  anxiety, or psychotic disturbances Niacin Nicotine NSAIDs, medications for pain and inflammation, like ibuprofen or naproxen Octreotide Pasireotide Pentamidine Phenytoin Probenecid Quinolone antibiotics such as ciprofloxacin, levofloxacin, ofloxacin Some  herbal dietary supplements Steroid medications such as prednisone or cortisone Sulfamethoxazole; trimethoprim Thyroid hormones Some medications can hide the warning symptoms of low blood sugar (hypoglycemia). You may need to monitor your blood sugar more closely if you are taking one of these medications. These include: Beta-blockers, often used for high blood pressure or heart problems (examples include atenolol, metoprolol, propranolol) Clonidine Guanethidine Reserpine This list may not describe all possible interactions. Give your health care provider a list of all the medicines, herbs, non-prescription drugs, or dietary supplements you use. Also tell them if you smoke, drink alcohol, or use illegal drugs. Some items may interact with your medicine. What should I watch for while using this medication? Visit your care team for regular checks on your progress. Drink plenty of fluids while taking this medication. Check with your care team if you get an attack of severe diarrhea, nausea, and vomiting. The loss of too much body fluid can make it dangerous for you to take this medication. A test called the HbA1C (A1C) will be monitored. This is a simple blood test. It measures your blood sugar control over the last 2 to 3 months. You will receive this test every 3 to 6 months. Learn how to check your blood sugar. Learn the symptoms of low and high blood sugar and how to manage them. Always carry a quick-source of sugar with you in case you have symptoms of low blood sugar. Examples include hard sugar candy or glucose tablets. Make sure others know that you can choke if you eat or drink when you develop serious symptoms of low blood sugar, such as seizures or unconsciousness. They must get medical help at once. Tell your care team if you have high blood sugar. You might need to change the dose of your medication. If you are sick or exercising more than usual, you might need to change the dose of your  medication. Do not skip meals. Ask your care team if you should avoid alcohol. Many nonprescription cough and cold products contain sugar or alcohol. These can affect blood sugar. Pens should never be shared. Even if the needle is changed, sharing may result in passing of viruses like hepatitis or HIV. Wear a medical ID bracelet or chain, and carry a card that describes your disease and details of your medication and dosage times What side effects may I notice from receiving this medication? Side effects that you should report to your care team as soon as possible: Allergic reactions--skin rash, itching, hives, swelling of the face, lips, tongue, or throat Change in vision Dehydration--increased thirst, dry mouth, feeling faint or lightheaded, headache, dark yellow or brown urine Gallbladder problems--severe stomach pain, nausea, vomiting, fever Heart palpitations--rapid, pounding, or irregular heartbeat Kidney injury--decrease in the amount of urine, swelling of the ankles, hands, or feet Pancreatitis--severe stomach pain that spreads to your back or gets worse after eating or when touched, fever, nausea, vomiting Thoughts of suicide or self-harm, worsening mood, feelings of depression Thyroid cancer--new mass or lump in the neck, pain or trouble swallowing, trouble breathing, hoarseness Side effects that usually do not require medical attention (report these to your care team if they continue or are bothersome): Diarrhea Loss of appetite Nausea Upset stomach This list may not describe all possible side effects. Call your doctor for medical advice about  side effects. You may report side effects to FDA at 1-800-FDA-1088. Where should I keep my medication? Keep out of the reach of children. Store unopened pens in a refrigerator between 2 and 8 degrees C (36 and 46 degrees F). Do not freeze. Protect from light and heat. After you first use the pen, it can be stored for 56 days at room  temperature between 15 and 30 degrees C (59 and 86 degrees F) or in a refrigerator. Throw away your used pen after 56 days or after the expiration date, whichever comes first. Do not store your pen with the needle attached. If the needle is left on, medication may leak from the pen. NOTE: This sheet is a summary. It may not cover all possible information. If you have questions about this medicine, talk to your doctor, pharmacist, or health care provider.  2024 Elsevier/Gold Standard (2022-10-24 00:00:00)

## 2023-01-26 ENCOUNTER — Other Ambulatory Visit: Payer: Self-pay | Admitting: Obstetrics & Gynecology

## 2023-01-26 LAB — CBC WITH DIFFERENTIAL/PLATELET
Basophils Absolute: 0 10*3/uL (ref 0.0–0.2)
Basos: 1 %
EOS (ABSOLUTE): 0.1 10*3/uL (ref 0.0–0.4)
Eos: 3 %
Hematocrit: 35.8 % (ref 34.0–46.6)
Hemoglobin: 11.8 g/dL (ref 11.1–15.9)
Immature Grans (Abs): 0 10*3/uL (ref 0.0–0.1)
Immature Granulocytes: 0 %
Lymphocytes Absolute: 1.3 10*3/uL (ref 0.7–3.1)
Lymphs: 30 %
MCH: 28.4 pg (ref 26.6–33.0)
MCHC: 33 g/dL (ref 31.5–35.7)
MCV: 86 fL (ref 79–97)
Monocytes Absolute: 0.3 10*3/uL (ref 0.1–0.9)
Monocytes: 7 %
Neutrophils Absolute: 2.6 10*3/uL (ref 1.4–7.0)
Neutrophils: 59 %
Platelets: 252 10*3/uL (ref 150–450)
RBC: 4.16 x10E6/uL (ref 3.77–5.28)
RDW: 14.3 % (ref 11.7–15.4)
WBC: 4.4 10*3/uL (ref 3.4–10.8)

## 2023-01-26 LAB — CMP14+EGFR
ALT: 13 IU/L (ref 0–32)
AST: 16 IU/L (ref 0–40)
Albumin/Globulin Ratio: 1.2 (ref 1.2–2.2)
Albumin: 4.1 g/dL (ref 3.9–4.9)
Alkaline Phosphatase: 109 IU/L (ref 44–121)
BUN/Creatinine Ratio: 13 (ref 9–23)
BUN: 10 mg/dL (ref 6–20)
Bilirubin Total: 0.2 mg/dL (ref 0.0–1.2)
CO2: 20 mmol/L (ref 20–29)
Calcium: 9.6 mg/dL (ref 8.7–10.2)
Chloride: 104 mmol/L (ref 96–106)
Creatinine, Ser: 0.75 mg/dL (ref 0.57–1.00)
Globulin, Total: 3.4 g/dL (ref 1.5–4.5)
Glucose: 108 mg/dL — ABNORMAL HIGH (ref 70–99)
Potassium: 4.3 mmol/L (ref 3.5–5.2)
Sodium: 138 mmol/L (ref 134–144)
Total Protein: 7.5 g/dL (ref 6.0–8.5)
eGFR: 107 mL/min/{1.73_m2} (ref >59)

## 2023-01-26 LAB — TSH: TSH: 0.836 u[IU]/mL (ref 0.450–4.500)

## 2023-01-26 LAB — MICROALBUMIN / CREATININE URINE RATIO
Creatinine, Urine: 125.7 mg/dL
Microalb/Creat Ratio: 124 mg/g creat — ABNORMAL HIGH (ref 0–29)
Microalbumin, Urine: 155.3 ug/mL

## 2023-01-26 LAB — T3: T3, Total: 128 ng/dL (ref 71–180)

## 2023-01-26 LAB — T4, FREE: Free T4: 1.1 ng/dL (ref 0.82–1.77)

## 2023-01-26 NOTE — Progress Notes (Signed)
Second request for pre op orders in [facility] spoke Del with at [facility].

## 2023-01-27 NOTE — Progress Notes (Signed)
Please place orders for PAT appointment scheduled 01/28/23. 

## 2023-01-27 NOTE — Patient Instructions (Signed)
SURGICAL WAITING ROOM VISITATION  Patients having surgery or a procedure may have no more than 2 support people in the waiting area - these visitors may rotate.    Children under the age of 59 must have an adult with them who is not the patient.  Due to an increase in RSV and influenza rates and associated hospitalizations, children ages 70 and under may not visit patients in [facility] hospitals.  If the patient needs to stay at the hospital during part of their recovery, the visitor guidelines for inpatient rooms apply. Pre-op nurse will coordinate an appropriate time for 1 support person to accompany patient in pre-op.  This support person may not rotate.    Please refer to the [facility] website for the visitor guidelines for Inpatients (after your surgery is over and you are in a regular room).    Your procedure is scheduled on: 02/04/23   Report to [facility] Main Entrance    Report to admitting at 5:15 AM   Call this number if you have problems the morning of surgery [phone]   Do not eat food :After Midnight.   After Midnight you may have the following liquids until 4:30 AM DAY OF SURGERY  Water Non-Citrus Juices (without pulp, NO RED-Apple, White grape, White cranberry) Black Coffee (NO MILK/CREAM OR CREAMERS, sugar ok)  Clear Tea (NO MILK/CREAM OR CREAMERS, sugar ok) regular and decaf                             Plain Jell-O (NO RED)                                           Fruit ices (not with fruit pulp, NO RED)                                     Popsicles (NO RED)                                                               Sports drinks like Gatorade (NO RED)              Drink 2 Ensure/G2 drinks AT 10:00 PM the night before surgery.        The day of surgery:  Drink ONE (1) Pre-Surgery Clear Ensure or G2 at AM the morning of surgery. Drink in one sitting. Do not sip.  This drink was given to you during your hospital  pre-op appointment  visit. Nothing else to drink after completing the  Pre-Surgery Clear Ensure or G2.          If you have questions, please contact your surgeon's office.   FOLLOW BOWEL PREP AND ANY ADDITIONAL PRE OP INSTRUCTIONS YOU RECEIVED FROM YOUR SURGEON'S OFFICE!!!     Oral Hygiene is also important to reduce your risk of infection.  Remember - BRUSH YOUR TEETH THE MORNING OF SURGERY WITH YOUR REGULAR TOOTHPASTE  DENTURES WILL BE REMOVED PRIOR TO SURGERY PLEASE DO NOT APPLY "Poly grip" OR ADHESIVES!!!   Do NOT smoke after Midnight   Take these medicines the morning of surgery with A SIP OF WATER:   DO NOT TAKE ANY ORAL DIABETIC MEDICATIONS DAY OF YOUR SURGERY  How to Manage Your Diabetes Before and After Surgery  Why is it important to control my blood sugar before and after surgery? Improving blood sugar levels before and after surgery helps healing and can limit problems. A way of improving blood sugar control is eating a healthy diet by:  Eating less sugar and carbohydrates  Increasing activity/exercise  Talking with your doctor about reaching your blood sugar goals High blood sugars (greater than 180 mg/dL) can raise your risk of infections and slow your recovery, so you will need to focus on controlling your diabetes during the weeks before surgery. Make sure that the doctor who takes care of your diabetes knows about your planned surgery including the date and location.  How do I manage my blood sugar before surgery? Check your blood sugar at least 4 times a day, starting 2 days before surgery, to make sure that the level is not too high or low. Check your blood sugar the morning of your surgery when you wake up and every 2 hours until you get to the Short Stay unit. If your blood sugar is less than 70 mg/dL, you will need to treat for low blood sugar: Do not take insulin. Treat a low blood sugar (less than 70 mg/dL) with  cup of clear juice  (cranberry or apple), 4 glucose tablets, OR glucose gel. Recheck blood sugar in 15 minutes after treatment (to make sure it is greater than 70 mg/dL). If your blood sugar is not greater than 70 mg/dL on recheck, call [phone] for further instructions. Report your blood sugar to the short stay nurse when you get to Short Stay.  If you are admitted to the hospital after surgery: Your blood sugar will be checked by the staff and you will probably be given insulin after surgery (instead of oral diabetes medicines) to make sure you have good blood sugar levels. The goal for blood sugar control after surgery is 80-180 mg/dL.   WHAT DO I DO ABOUT MY DIABETES MEDICATION?  Do not take oral diabetes medicines (pills) the morning of surgery.  THE NIGHT BEFORE SURGERY, take     units of       insulin.       THE MORNING OF SURGERY, take   units of         insulin.  DO NOT TAKE THE FOLLOWING 7 DAYS PRIOR TO SURGERY: Ozempic, Wegovy, Rybelsus (Semaglutide), Byetta (exenatide), Bydureon (exenatide ER), Victoza, Saxenda (liraglutide), or Trulicity (dulaglutide) Mounjaro (Tirzepatide) Adlyxin (Lixisenatide), Polyethylene Glycol Loxenatide.  If your CBG is greater than 220 mg/dL, you may take  of your sliding scale  (correction) dose of insulin.    For patients with insulin pumps: Contact your diabetes doctor for specific instructions before surgery. Decrease basal rates by 20% at midnight the night before your surgery. Note that if your surgery is planned to be longer than 2 hours, your insulin pump will be removed and intravenous (IV) insulin will be started and managed by the nurses and the anesthesiologist. You will be able to restart your insulin pump once you are awake and able to manage it.  Make sure to bring insulin pump supplies to the hospital with you in case the  site needs to be changed.   Reviewed and Endorsed by [facility] Patient Education Committee, August 2015  Bring CPAP mask  and tubing day of surgery.                              You may not have any metal on your body including hair pins, jewelry, and body piercing             Do not wear make-up, lotions, powders, perfumes, or deodorant  Do not wear nail polish including gel and S&S, artificial/acrylic nails, or any other type of covering on natural nails including finger and toenails. If you have artificial nails, gel coating, etc. that needs to be removed by a nail salon please have this removed prior to surgery or surgery may need to be canceled/ delayed if the surgeon/ anesthesia feels like they are unable to be safely monitored.   Do not shave  48 hours prior to surgery.    Do not bring valuables to the hospital. [facility] IS NOT             RESPONSIBLE   FOR VALUABLES.   Contacts, glasses, dentures or bridgework may not be worn into surgery.  DO NOT BRING YOUR HOME MEDICATIONS TO THE HOSPITAL. PHARMACY WILL DISPENSE MEDICATIONS LISTED ON YOUR MEDICATION LIST TO YOU DURING YOUR ADMISSION IN THE HOSPITAL!    Patients discharged on the day of surgery will not be allowed to drive home.  Someone NEEDS to stay with you for the first 24 hours after anesthesia.   Special Instructions: Bring a copy of your healthcare power of attorney and living will documents the day of surgery if you haven't scanned them before.              Please read over the following fact sheets you were given: IF YOU HAVE QUESTIONS ABOUT YOUR PRE-OP INSTRUCTIONS PLEASE CALL [phone]   If you received a COVID test during your pre-op visit  it is requested that you wear a mask when out in public, stay away from anyone that may not be feeling well and notify your surgeon if you develop symptoms. If you test positive for Covid or have been in contact with anyone that has tested positive in the last 10 days please notify you surgeon.    [place] - Preparing for Surgery Before surgery, you can play an important role.   Because skin is not sterile, your skin needs to be as free of germs as possible.  You can reduce the number of germs on your skin by washing with CHG (chlorahexidine gluconate) soap before surgery.  CHG is an antiseptic cleaner which kills germs and bonds with the skin to continue killing germs even after washing. Please DO NOT use if you have an allergy to CHG or antibacterial soaps.  If your skin becomes reddened/irritated stop using the CHG and inform your nurse when you arrive at Short Stay. Do not shave (including legs and underarms) for at least 48 hours prior to the first CHG shower.  You may shave your face/neck.  Please follow these instructions carefully:  1.  Shower with CHG Soap the night before surgery and the  morning of surgery.  2.  If you choose to wash your hair, wash your hair first as usual with your normal  shampoo.  3.  After you shampoo, rinse your hair and body thoroughly to remove the shampoo.                             4.  Use CHG as you would any other liquid soap.  You can apply chg directly to the skin and wash.  Gently with a scrungie or clean washcloth.  5.  Apply the CHG Soap to your body ONLY FROM THE NECK DOWN.   Do   not use on face/ open                           Wound or open sores. Avoid contact with eyes, ears mouth and   genitals (private parts).                       Wash face,  Genitals (private parts) with your normal soap.             6.  Wash thoroughly, paying special attention to the area where your    surgery  will be performed.  7.  Thoroughly rinse your body with warm water from the neck down.  8.  DO NOT shower/wash with your normal soap after using and rinsing off the CHG Soap.                9.  Pat yourself dry with a clean towel.            10.  Wear clean pajamas.            11.  Place clean sheets on your bed the night of your first shower and do not  sleep with pets. Day of Surgery : Do not apply any lotions/deodorants the morning of  surgery.  Please wear clean clothes to the hospital/surgery center.  FAILURE TO FOLLOW THESE INSTRUCTIONS MAY RESULT IN THE CANCELLATION OF YOUR SURGERY  PATIENT SIGNATURE_________________________________  NURSE SIGNATURE__________________________________  ________________________________________________________________________

## 2023-01-27 NOTE — Progress Notes (Addendum)
Called patient multiple times regarding her PAT appointment at 0900. Phone went straight to voicemail and voicemail box is full.  Called patient's mother (listed alternative contact) and she stated she would tell the patient to call. Did not receive a call back. Gave chart to charge RN and let scheduler know.

## 2023-01-28 ENCOUNTER — Encounter (HOSPITAL_COMMUNITY)
Admission: RE | Admit: 2023-01-28 | Discharge: 2023-01-28 | Disposition: A | Discharge status: Home / Self Care | Payer: Medicaid Other | Source: Ambulatory Visit | Attending: Anesthesiology | Admitting: Anesthesiology

## 2023-01-28 ENCOUNTER — Encounter (HOSPITAL_COMMUNITY): Payer: Self-pay

## 2023-01-28 DIAGNOSIS — E119 Type 2 diabetes mellitus without complications: Secondary | ICD-10-CM

## 2023-02-03 ENCOUNTER — Telehealth: Payer: Self-pay

## 2023-02-03 ENCOUNTER — Other Ambulatory Visit: Payer: Self-pay

## 2023-02-03 NOTE — Telephone Encounter (Signed)
FYI

## 2023-02-03 NOTE — Telephone Encounter (Signed)
Prior Authorization submitted today via CoverMyMeds Key: [id number]

## 2023-02-03 NOTE — Telephone Encounter (Signed)
Copied from CRM [id number]. Topic: General - Other >> Feb 03, 2023  1:11 PM Ja-Kwan M wrote: Reason for CRM: Pt reports that her pharmacy told her that the Rx for Semaglutide,0.25 or 0.5MG /DOS, (OZEMPIC, 0.25 OR 0.5 MG/DOSE,) 2 MG/1.5ML SOPN needs prior authorization.

## 2023-02-04 ENCOUNTER — Ambulatory Visit (HOSPITAL_COMMUNITY)
Admission: RE | Admit: 2023-02-04 | Payer: Medicaid Other | Source: Home / Self Care | Admitting: Obstetrics & Gynecology

## 2023-02-04 ENCOUNTER — Encounter (HOSPITAL_COMMUNITY): Admission: RE | Payer: Self-pay | Source: Home / Self Care

## 2023-02-04 ENCOUNTER — Other Ambulatory Visit: Payer: Self-pay

## 2023-02-04 SURGERY — SALPINGECTOMY, ROBOT-ASSISTED
Anesthesia: General | Laterality: Bilateral

## 2023-02-04 NOTE — Telephone Encounter (Signed)
Prior authorization approved until 02/03/2024. [id number]. [facility] was able to successfully process a claim through insurance. Left message for patient today.

## 2023-03-12 ENCOUNTER — Ambulatory Visit (INDEPENDENT_AMBULATORY_CARE_PROVIDER_SITE_OTHER): Payer: Medicaid Other

## 2023-03-12 ENCOUNTER — Ambulatory Visit
Admission: EM | Admit: 2023-03-12 | Discharge: 2023-03-12 | Disposition: A | Discharge status: Home / Self Care | Payer: Medicaid Other | Attending: Internal Medicine | Admitting: Internal Medicine

## 2023-03-12 DIAGNOSIS — S93401A Sprain of unspecified ligament of right ankle, initial encounter: Secondary | ICD-10-CM

## 2023-03-12 DIAGNOSIS — M25571 Pain in right ankle and joints of right foot: Secondary | ICD-10-CM | POA: Diagnosis not present

## 2023-03-12 NOTE — Discharge Instructions (Signed)
Your x-ray was negative for fracture.  Your symptoms and exam are consistent with an ankle sprain.  Ace wrap to the ankle to help with your swelling and support the joint.  You may use crutches as needed to get around.  Elevate and ice the ankle as often as needed.  You may take Tylenol or over-the-counter ibuprofen as needed for pain.  Please follow-up with your PCP if your symptoms or not improving.  Please go to the emergency room for any worsening symptoms.  I hope you feel better soon!

## 2023-03-12 NOTE — ED Triage Notes (Signed)
Pt presents to UC w/ c/o walking down a hill and twisted right ankle and heard a pop which happened a few minutes ago. Swelling present. Pain upon weight bearing.

## 2023-03-12 NOTE — ED Provider Notes (Signed)
[facility]    CSN: [id number] Arrival date & time: 03/12/23  1425      History   Chief Complaint Chief Complaint  Patient presents with   Ankle Pain    HPI Marilyn Hughes is a 32 y.o. female presents for right ankle pain.  Patient reports earlier today she was walking down a hill when she rolled her right ankle.  She did hear a pop and had immediate onset of pain and swelling.  She can bear very minimal weight but it is very painful.  No bruising or numbness or tingling.  No history of right ankle injuries or surgeries in the past.  She has not taken any OTC medications for symptoms since onset.  No other concerns at this time.   Ankle Pain   Past Medical History:  Diagnosis Date   Diabetes mellitus without complication (HCC)    Type 2   Pregnancy induced hypertension    Vaginal Pap smear, abnormal     Patient Active Problem List   Diagnosis Date Noted   Iron deficiency anemia 10/12/2022   Normal postpartum course 10/12/2022   Hypokalemia 10/12/2022   Hypocalcemia 10/12/2022   Encounter for induction of labor 10/12/2022   SVD (spontaneous vaginal delivery) 10/12/2022   History of pre-eclampsia in prior pregnancy, currently pregnant in third trimester 10/08/2022   Pre-existing type 2 diabetes mellitus in pregnancy 10/08/2022   Anemia of mother in pregnancy 10/08/2022   Preeclampsia, severe 10/08/2022    Past Surgical History:  Procedure Laterality Date   back sx  2005   scoliosis    HERNIA REPAIR      OB History     Gravida  4   Para  3   Term  1   Preterm  2   AB  1   Living  3      SAB      IAB      Ectopic  1   Multiple  0   Live Births  3            Home Medications    Prior to Admission medications   Medication Sig Start Date End Date Taking? Authorizing Provider  metFORMIN (GLUCOPHAGE) 500 MG tablet Take 1 tablet (500 mg total) by mouth 2 (two) times daily with a meal. 01/25/23   Hoy Register, MD  NIFEdipine  (ADALAT CC) 60 MG 24 hr tablet Take 1 tablet (60 mg total) by mouth daily. Patient not taking: Reported on 01/25/2023 10/13/22   Dale Cocoa, FNP  Semaglutide,0.25 or 0.5MG /DOS, (OZEMPIC, 0.25 OR 0.5 MG/DOSE,) 2 MG/1.5ML SOPN Inject 0.25 mg into the skin once a week. 01/25/23   Hoy Register, MD    Family History Family History  Problem Relation Age of Onset   Hypertension Mother    Diabetes Mother     Social History Social History   Tobacco Use   Smoking status: Never   Smokeless tobacco: Never  Vaping Use   Vaping status: Never Used  Substance Use Topics   Alcohol use: Not Currently   Drug use: Never     Allergies   Patient has no known allergies.   Review of Systems Review of Systems  Musculoskeletal:        Right ankle pain     Physical Exam Triage Vital Signs ED Triage Vitals  Encounter Vitals Group     BP 03/12/23 1436 (!) 145/93     Systolic BP Percentile --  Diastolic BP Percentile --      Pulse Rate 03/12/23 1436 91     Resp 03/12/23 1436 16     Temp 03/12/23 1436 98.6 F (37 C)     Temp Source 03/12/23 1436 Oral     SpO2 03/12/23 1436 97 %     Weight --      Height --      Head Circumference --      Peak Flow --      Pain Score 03/12/23 1438 8     Pain Loc --      Pain Education --      Exclude from Growth Chart --    No data found.  Updated Vital Signs BP (!) 145/93 (BP Location: Right Arm)   Pulse 91   Temp 98.6 F (37 C) (Oral)   Resp 16   LMP 03/11/2023 (Exact Date)   SpO2 97%   Visual Acuity Right Eye Distance:   Left Eye Distance:   Bilateral Distance:    Right Eye Near:   Left Eye Near:    Bilateral Near:     Physical Exam Vitals and nursing note reviewed.  Constitutional:      Appearance: Normal appearance.  HENT:     Head: Normocephalic and atraumatic.  Eyes:     Pupils: Pupils are equal, round, and reactive to light.  Cardiovascular:     Rate and Rhythm: Normal rate.  Pulmonary:     Effort: Pulmonary  effort is normal.  Musculoskeletal:     Comments: There is mild swelling without ecchymosis or erythema to the lateral right malleolus.  Tender to palpation to site.  No tenderness to medial malleolus or to proximal/distal foot.  Full range of motion with pain on active dorsi flexion.  DP +2.  Skin:    General: Skin is warm and dry.  Neurological:     General: No focal deficit present.     Mental Status: She is alert and oriented to person, place, and time.  Psychiatric:        Mood and Affect: Mood normal.        Behavior: Behavior normal.      UC Treatments / Results  Labs (all labs ordered are listed, but only abnormal results are displayed) Labs Reviewed - No data to display  EKG   Radiology DG Ankle Complete Right  Result Date: 03/12/2023 CLINICAL DATA:  Right ankle pain and swelling EXAM: RIGHT ANKLE - COMPLETE 3+ VIEW COMPARISON:  None Available. FINDINGS: Small plantar and Achilles calcaneal spurs. No fracture, malalignment, or acute bony findings. The malleoli, talar dome, and plafond appear intact. IMPRESSION: 1. Small plantar and Achilles calcaneal spurs. No acute bony findings. Electronically Signed   By: Gaylyn Rong M.D.   On: 03/12/2023 15:17    Procedures Procedures (including critical care time)  Medications Ordered in UC Medications - No data to display  Initial Impression / Assessment and Plan / UC Course  I have reviewed the triage vital signs and the nursing notes.  Pertinent labs & imaging results that were available during my care of the patient were reviewed by me and considered in my medical decision making (see chart for details).     Reviewed symptoms and x-ray results with patient.  No red flags.  Negative for fracture.  Discussed ankle sprain.  RICE therapy reviewed and Ace wrap applied in clinic.  Patient did request crutches which she was fitted for.  OTC analgesics as needed.  PCP follow-up if symptoms do not improve.  ER precautions  reviewed and patient verbalized understanding. Final Clinical Impressions(s) / UC Diagnoses   Final diagnoses:  Sprain of right ankle, unspecified ligament, initial encounter     Discharge Instructions      Your x-ray was negative for fracture.  Your symptoms and exam are consistent with an ankle sprain.  Ace wrap to the ankle to help with your swelling and support the joint.  You may use crutches as needed to get around.  Elevate and ice the ankle as often as needed.  You may take Tylenol or over-the-counter ibuprofen as needed for pain.  Please follow-up with your PCP if your symptoms or not improving.  Please go to the emergency room for any worsening symptoms.  I hope you feel better soon!     ED Prescriptions   None    PDMP not reviewed this encounter.   Radford Pax, NP 03/12/23 1524

## 2023-03-22 ENCOUNTER — Ambulatory Visit: Payer: Medicaid Other | Admitting: Family Medicine

## 2023-07-15 ENCOUNTER — Ambulatory Visit
Admission: EM | Admit: 2023-07-15 | Discharge: 2023-07-15 | Disposition: A | Discharge status: Home / Self Care | Payer: Medicaid Other | Attending: Nurse Practitioner | Admitting: Nurse Practitioner

## 2023-07-15 DIAGNOSIS — J020 Streptococcal pharyngitis: Secondary | ICD-10-CM

## 2023-07-15 LAB — POCT RAPID STREP A (OFFICE): Rapid Strep A Screen: POSITIVE — AB

## 2023-07-15 MED ORDER — AMOXICILLIN 500 MG PO CAPS: 500.0000 mg | ORAL_CAPSULE | Freq: Two times a day (BID) | ORAL | 0 refills | Status: AC

## 2023-07-15 MED ORDER — LIDOCAINE VISCOUS HCL 2 % MT SOLN: 15.0000 mL | OROMUCOSAL | 0 refills | Status: AC | PRN

## 2023-07-15 MED ORDER — LIDOCAINE VISCOUS HCL 2 % MT SOLN: OROMUCOSAL | 0 refills | Status: DC

## 2023-07-15 NOTE — ED Triage Notes (Signed)
Pt presents to UC w/ c/o sore throat, painful swallowing x2 days. Tylenol for pain relief.

## 2023-07-15 NOTE — ED Provider Notes (Signed)
[facility]    CSN: [id number] Arrival date & time: 07/15/23  1257      History   Chief Complaint Chief Complaint  Patient presents with   Sore Throat    HPI Marilyn Hughes is a 32 y.o. female.   HPI  She is in today for evaluation of sore throat with difficulty swallowing x 2 days.  She reports that she does have a history of strep throat.  Last approximately 6 months ago.  She does have children but denies either of them being sick.  She reports that her young child is doing very well.  She has tried Tylenol which was not effective.  She reports that she has a nodule her thyroid and wonders if this may have something to do with her getting the recurrent throat.  She denies any known fever, chills, headaches, dizziness, chest pain, shortness of breath, nausea, vomiting or diarrhea. Past Medical History:  Diagnosis Date   Diabetes mellitus without complication (HCC)    Type 2   Pregnancy induced hypertension    Vaginal Pap smear, abnormal     Patient Active Problem List   Diagnosis Date Noted   Iron deficiency anemia 10/12/2022   Normal postpartum course 10/12/2022   Hypokalemia 10/12/2022   Hypocalcemia 10/12/2022   Encounter for induction of labor 10/12/2022   SVD (spontaneous vaginal delivery) 10/12/2022   History of pre-eclampsia in prior pregnancy, currently pregnant in third trimester 10/08/2022   Pre-existing type 2 diabetes mellitus in pregnancy 10/08/2022   Anemia of mother in pregnancy 10/08/2022   Preeclampsia, severe 10/08/2022    Past Surgical History:  Procedure Laterality Date   back sx  2005   scoliosis    HERNIA REPAIR      OB History     Gravida  4   Para  3   Term  1   Preterm  2   AB  1   Living  3      SAB      IAB      Ectopic  1   Multiple  0   Live Births  3            Home Medications    Prior to Admission medications   Medication Sig Start Date End Date Taking? Authorizing Provider  amoxicillin  (AMOXIL) 500 MG capsule Take 1 capsule (500 mg total) by mouth 2 (two) times daily. 07/15/23  Yes Kemani Heidel, Shana Chute, NP  lidocaine (XYLOCAINE) 2 % solution Take 10 mLs orally, mixed with an equal amount of Milanta, no more than 3 times per day, PRN for esophageal pain. Food should not be ingested for 60 minutes following use of medication. 07/15/23  Yes Barbette Merino, NP  metFORMIN (GLUCOPHAGE) 500 MG tablet Take 1 tablet (500 mg total) by mouth 2 (two) times daily with a meal. 01/25/23  Yes Newlin, Enobong, MD  Semaglutide,0.25 or 0.5MG /DOS, (OZEMPIC, 0.25 OR 0.5 MG/DOSE,) 2 MG/1.5ML SOPN Inject 0.25 mg into the skin once a week. 01/25/23  Yes Hoy Register, MD  NIFEdipine (ADALAT CC) 60 MG 24 hr tablet Take 1 tablet (60 mg total) by mouth daily. Patient not taking: Reported on 01/25/2023 10/13/22   Dale Morton, FNP    Family History Family History  Problem Relation Age of Onset   Hypertension Mother    Diabetes Mother     Social History Social History   Tobacco Use   Smoking status: Never   Smokeless tobacco: Never  Vaping Use   Vaping status: Never Used  Substance Use Topics   Alcohol use: Not Currently   Drug use: Never     Allergies   Patient has no known allergies.   Review of Systems Review of Systems   Physical Exam Triage Vital Signs ED Triage Vitals  Encounter Vitals Group     BP 07/15/23 1349 (!) 144/85     Systolic BP Percentile --      Diastolic BP Percentile --      Pulse Rate 07/15/23 1349 98     Resp 07/15/23 1349 16     Temp 07/15/23 1349 98.8 F (37.1 C)     Temp Source 07/15/23 1349 Oral     SpO2 07/15/23 1349 96 %     Weight --      Height --      Head Circumference --      Peak Flow --      Pain Score 07/15/23 1348 7     Pain Loc --      Pain Education --      Exclude from Growth Chart --    No data found.  Updated Vital Signs BP (!) 144/85 (BP Location: Left Arm)   Pulse 98   Temp 98.8 F (37.1 C) (Oral)   Resp 16   SpO2 96%    Visual Acuity Right Eye Distance:   Left Eye Distance:   Bilateral Distance:    Right Eye Near:   Left Eye Near:    Bilateral Near:     Physical Exam Constitutional:      Appearance: She is obese.  HENT:     Head: Normocephalic and atraumatic.     Nose: No congestion.     Mouth/Throat:     Pharynx: Posterior oropharyngeal erythema present.     Tonsils: 2+ on the right. 3+ on the left.  Eyes:     Conjunctiva/sclera: Conjunctivae normal.     Pupils: Pupils are equal, round, and reactive to light.  Cardiovascular:     Rate and Rhythm: Normal rate.  Abdominal:     Palpations: Abdomen is soft.  Musculoskeletal:     Cervical back: Normal range of motion and neck supple.  Skin:    General: Skin is warm and dry.     Capillary Refill: Capillary refill takes less than 2 seconds.  Neurological:     General: No focal deficit present.     Mental Status: She is alert.      UC Treatments / Results  Labs (all labs ordered are listed, but only abnormal results are displayed) Labs Reviewed  POCT RAPID STREP A (OFFICE) - Abnormal; Notable for the following components:      Result Value   Rapid Strep A Screen Positive (*)    All other components within normal limits    EKG   Radiology No results found.  Procedures Procedures (including critical care time)  Medications Ordered in UC Medications - No data to display  Initial Impression / Assessment and Plan / UC Course  I have reviewed the triage vital signs and the nursing notes.  Pertinent labs & imaging results that were available during my care of the patient were reviewed by me and considered in my medical decision making (see chart for details).     Sore throat Final Clinical Impressions(s) / UC Diagnoses   Final diagnoses:  Streptococcal sore throat     Discharge Instructions      Your Strep  Test Is positive.  You have been prescribed amoxicillin 500 mg twice daily for 10  days please take medication  as directed completing the entire prescription.  This helps avoid resistance. We encourage conservative treatment with symptom relief. We encourage you to use Tylenol for your pain (Remember to use as directed do not exceed daily dosing recommendations). We also encourage salt water gargles for your sore throat. You should also consider throat lozenges and chloraseptic spray.  We have prescribed you a lidocaine 2 % solution for the sore throat which you can swish and sip. Be careful not to eat within one hour of using the lidocaine solution so that you do not bit your tongue or get choked.       ED Prescriptions     Medication Sig Dispense Auth. Provider   amoxicillin (AMOXIL) 500 MG capsule Take 1 capsule (500 mg total) by mouth 2 (two) times daily. 20 capsule Thad Ranger M, NP   lidocaine (XYLOCAINE) 2 % solution Take 10 mLs orally, mixed with an equal amount of Milanta, no more than 3 times per day, PRN for esophageal pain. Food should not be ingested for 60 minutes following use of medication. 100 mL Barbette Merino, NP      PDMP not reviewed this encounter.   Thad Ranger [place], [place] 07/15/23 [phone]

## 2023-07-15 NOTE — Discharge Instructions (Addendum)
Your Strep Test Is positive.  You have been prescribed amoxicillin 500 mg twice daily for 10  days please take medication as directed completing the entire prescription.  This helps avoid resistance. We encourage conservative treatment with symptom relief. We encourage you to use Tylenol for your pain (Remember to use as directed do not exceed daily dosing recommendations). We also encourage salt water gargles for your sore throat. You should also consider throat lozenges and chloraseptic spray.  We have prescribed you a lidocaine 2 % solution for the sore throat which you can swish and sip. Be careful not to eat within one hour of using the lidocaine solution so that you do not bit your tongue or get choked.

## 2024-01-06 ENCOUNTER — Other Ambulatory Visit: Payer: Self-pay

## 2024-01-29 ENCOUNTER — Other Ambulatory Visit: Payer: Self-pay | Admitting: Family Medicine

## 2024-01-29 DIAGNOSIS — E1169 Type 2 diabetes mellitus with other specified complication: Secondary | ICD-10-CM

## 2024-01-30 ENCOUNTER — Telehealth: Payer: Self-pay | Admitting: Family Medicine

## 2024-01-30 NOTE — Telephone Encounter (Signed)
 Patient has been called and informed that refill has been denied due to her needing an appointment. She has been informed to wait until her upcoming appointment for refill.

## 2024-01-30 NOTE — Telephone Encounter (Signed)
 Copied from CRM [id number]. Topic: Clinical - Prescription Issue >> Jan 30, 2024  2:20 PM Fredrica W wrote:  Reason for CRM: Patient called to request refill for Semaglutide ,0.25 or 0.5MG /DOS, (OZEMPIC , 0.25 OR 0.5 MG/DOSE,) 2 MG/1.5ML SOPN. Request already submitted by Pharmacy Yesterday. Thank You

## 2024-01-30 NOTE — Telephone Encounter (Signed)
 Requested medications are due for refill today.  unsure  Requested medications are on the active medications list.  yes  Last refill. 01/25/2023 2mL 6 rf  Future visit scheduled.   yes  Notes to clinic.  Please review for refill    Requested Prescriptions  Pending Prescriptions Disp Refills   OZEMPIC , 0.25 OR 0.5 MG/DOSE, 2 MG/3ML SOPN [Pharmacy Med Name: OZEMPIC  0.25-0.5 MG/DOSE PEN]  6    Sig: INJECT 0.25MG  INTO THE SKIN ONE TIME PER WEEK     Endocrinology:  Diabetes - GLP-1 Receptor Agonists - semaglutide  Failed - 01/30/2024  5:34 PM      Failed - HBA1C in normal range and within 180 days    HbA1c, POC (controlled diabetic range)  Date Value Ref Range Status  01/25/2023 6.1 0.0 - 7.0 % Final         Failed - Cr in normal range and within 360 days    Creatinine, Ser  Date Value Ref Range Status  01/25/2023 0.75 0.57 - 1.00 mg/dL Final   Creatinine, Urine  Date Value Ref Range Status  10/08/2022 31 mg/dL Final         Failed - Valid encounter within last 6 months    Recent Outpatient Visits           1 year ago Type 2 diabetes mellitus with other specified complication, without long-term current use of insulin  (HCC)   [facility] [place] Joaquin Mulberry, MD   2 years ago Type 2 diabetes mellitus with other specified complication, without long-term current use of insulin  [facility])   [facility] [place] Joaquin Mulberry, MD   3 years ago Neck swelling   [facility] [place] Joaquin Mulberry, MD

## 2024-02-20 ENCOUNTER — Encounter: Admitting: Family Medicine

## 2024-03-20 ENCOUNTER — Encounter: Admitting: Family Medicine

## 2024-03-21 ENCOUNTER — Ambulatory Visit

## 2024-03-21 ENCOUNTER — Ambulatory Visit: Payer: Self-pay | Admitting: Urgent Care

## 2024-03-21 ENCOUNTER — Ambulatory Visit
Admission: EM | Admit: 2024-03-21 | Discharge: 2024-03-21 | Disposition: A | Discharge status: Home / Self Care | Attending: Urgent Care | Admitting: Urgent Care

## 2024-03-21 DIAGNOSIS — M79645 Pain in left finger(s): Secondary | ICD-10-CM | POA: Diagnosis not present

## 2024-03-21 DIAGNOSIS — S61307A Unspecified open wound of left little finger with damage to nail, initial encounter: Secondary | ICD-10-CM | POA: Diagnosis not present

## 2024-03-21 MED ORDER — IBUPROFEN 600 MG PO TABS: 600.0000 mg | ORAL_TABLET | Freq: Four times a day (QID) | ORAL | 0 refills | Status: AC | PRN

## 2024-03-21 NOTE — Discharge Instructions (Addendum)
 For protection of the left pinky fingernail try to keep the end of it covered with Coban as frequently as possible.  When you change the Coban to try for 30 minutes to an hour of air time for the skin itself.  Use ibuprofen  as needed for pain and inflammation.

## 2024-03-21 NOTE — ED Provider Notes (Signed)
 [facility]  Note:  This document was prepared using Conservation officer, historic buildings and may include unintentional dictation errors.  MRN: [id number] DOB: [birth year]  Subjective:   Marilyn Hughes is a 33 y.o. female presenting for 1 day history of persistent left pinky finger pain.  Patient excellently jammed her finger into the wall last night.  This caused complete separation of the nail per patient.  Has kept it clean and dry.  No swelling, fever, active bleeding.  No current facility-administered medications for this encounter.  Current Outpatient Medications:    amoxicillin  (AMOXIL ) 500 MG capsule, Take 1 capsule (500 mg total) by mouth 2 (two) times daily., Disp: 20 capsule, Rfl: 0   lidocaine  (XYLOCAINE ) 2 % solution, Use as directed 15 mLs in the mouth or throat as needed for mouth pain., Disp: 100 mL, Rfl: 0   metFORMIN  (GLUCOPHAGE ) 500 MG tablet, Take 1 tablet (500 mg total) by mouth 2 (two) times daily with a meal., Disp: 180 tablet, Rfl: 1   NIFEdipine  (ADALAT  CC) 60 MG 24 hr tablet, Take 1 tablet (60 mg total) by mouth daily. (Patient not taking: Reported on 01/25/2023), Disp: 30 tablet, Rfl: 1   Semaglutide ,0.25 or 0.5MG /DOS, (OZEMPIC , 0.25 OR 0.5 MG/DOSE,) 2 MG/1.5ML SOPN, Inject 0.25 mg into the skin once a week., Disp: 2 mL, Rfl: 6   No Known Allergies  Past Medical History:  Diagnosis Date   Diabetes mellitus without complication (HCC)    Type 2   Pregnancy induced hypertension    Vaginal Pap smear, abnormal      Past Surgical History:  Procedure Laterality Date   back sx  2005   scoliosis    HERNIA REPAIR      Family History  Problem Relation Age of Onset   Hypertension Mother    Diabetes Mother     Social History   Tobacco Use   Smoking status: Never   Smokeless tobacco: Never  Vaping Use   Vaping status: Never Used  Substance Use Topics   Alcohol use: Not Currently   Drug use: Never    ROS   Objective:    Vitals: BP 131/89 (BP Location: Left Arm)   Pulse 85   Temp 98.6 F (37 C) (Oral)   Resp 20   LMP 03/02/2024   SpO2 98%   Physical Exam Constitutional:      General: She is not in acute distress.    Appearance: Normal appearance. She is well-developed. She is not ill-appearing, toxic-appearing or diaphoretic.  HENT:     Head: Normocephalic and atraumatic.     Nose: Nose normal.     Mouth/Throat:     Mouth: Mucous membranes are moist.  Eyes:     General: No scleral icterus.       Right eye: No discharge.        Left eye: No discharge.     Extraocular Movements: Extraocular movements intact.  Cardiovascular:     Rate and Rhythm: Normal rate.  Pulmonary:     Effort: Pulmonary effort is normal.  Musculoskeletal:       Hands:  Skin:    General: Skin is warm and dry.  Neurological:     General: No focal deficit present.     Mental Status: She is alert and oriented to person, place, and time.  Psychiatric:        Mood and Affect: Mood normal.        Behavior: Behavior normal.  Verbal consent obtained.  Sterile prep and drape using iodine, alcohol.  Digital finger block performed of the left fifth finger at 2 sites including the palmar surface between the 4th and 5th metacarpals and the ulnar aspect of the left hand.  Now with split using the nail splitter.  The proximal nail left intact.  There was no bleeding.  Patient tolerated this procedure well.  Secured with Coban.  Assessment and Plan :   PDMP not reviewed this encounter.  1. Avulsion of nail of left little finger   2. Finger pain, left    X-ray over-read was pending at time of discharge, recommended follow up with only abnormal results. Otherwise will not call for negative over-read. Patient was in agreement. Start ibuprofen  for pain and inflammation.  General wound care reviewed.  Counseled patient on potential for adverse effects with medications prescribed/recommended today, ER and return-to-clinic  precautions discussed, patient verbalized understanding.    Christopher Savannah, PA-C 03/21/24 1006

## 2024-03-21 NOTE — ED Triage Notes (Signed)
 Pt states I jammed my nail in a wall last night-left pinky finger-NAD-steady gait

## 2024-07-02 ENCOUNTER — Other Ambulatory Visit: Payer: Self-pay | Admitting: Medical Genetics
